# Patient Record
Sex: Female | Born: 1960 | Race: White | Hispanic: No | Smoking: Former smoker
Health system: Southern US, Community
[De-identification: ages and names within clinical notes are randomized; demographics above are authoritative.]

## PROBLEM LIST (undated history)

## (undated) DIAGNOSIS — Z86718 Personal history of other venous thrombosis and embolism: Secondary | ICD-10-CM

## (undated) DIAGNOSIS — M7500 Adhesive capsulitis of unspecified shoulder: Secondary | ICD-10-CM

## (undated) DIAGNOSIS — N809 Endometriosis, unspecified: Secondary | ICD-10-CM

## (undated) DIAGNOSIS — J45990 Exercise induced bronchospasm: Secondary | ICD-10-CM

## (undated) DIAGNOSIS — Z9289 Personal history of other medical treatment: Secondary | ICD-10-CM

## (undated) DIAGNOSIS — Z87828 Personal history of other (healed) physical injury and trauma: Secondary | ICD-10-CM

## (undated) DIAGNOSIS — Z87898 Personal history of other specified conditions: Secondary | ICD-10-CM

## (undated) HISTORY — PX: TUBAL LIGATION: SHX77

## (undated) HISTORY — DX: Endometriosis, unspecified: N80.9

## (undated) HISTORY — DX: Exercise induced bronchospasm: J45.990

## (undated) HISTORY — DX: Personal history of other specified conditions: Z87.898

## (undated) HISTORY — DX: Personal history of other medical treatment: Z92.89

## (undated) HISTORY — DX: Adhesive capsulitis of unspecified shoulder: M75.00

## (undated) HISTORY — PX: OTHER SURGICAL HISTORY: SHX169

## (undated) HISTORY — DX: Personal history of other (healed) physical injury and trauma: Z87.828

## (undated) HISTORY — DX: Personal history of other venous thrombosis and embolism: Z86.718

---
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
## 1997-12-09 ENCOUNTER — Inpatient Hospital Stay (HOSPITAL_COMMUNITY): Admission: AD | Admit: 1997-12-09 | Discharge: 1997-12-12 | Payer: Self-pay | Admitting: Obstetrics and Gynecology

## 1999-01-26 ENCOUNTER — Other Ambulatory Visit: Admission: RE | Admit: 1999-01-26 | Discharge: 1999-01-26 | Payer: Self-pay | Admitting: Obstetrics and Gynecology

## 2000-03-02 ENCOUNTER — Other Ambulatory Visit: Admission: RE | Admit: 2000-03-02 | Discharge: 2000-03-02 | Payer: Self-pay | Admitting: Obstetrics and Gynecology

## 2001-06-07 ENCOUNTER — Other Ambulatory Visit: Admission: RE | Admit: 2001-06-07 | Discharge: 2001-06-07 | Payer: Self-pay | Admitting: Obstetrics and Gynecology

## 2002-07-04 ENCOUNTER — Other Ambulatory Visit: Admission: RE | Admit: 2002-07-04 | Discharge: 2002-07-04 | Payer: Self-pay | Admitting: Obstetrics and Gynecology

## 2002-09-04 DIAGNOSIS — Z86718 Personal history of other venous thrombosis and embolism: Secondary | ICD-10-CM

## 2002-09-04 HISTORY — DX: Personal history of other venous thrombosis and embolism: Z86.718

## 2003-01-09 ENCOUNTER — Ambulatory Visit (HOSPITAL_COMMUNITY): Admission: RE | Admit: 2003-01-09 | Discharge: 2003-01-09 | Payer: Self-pay | Admitting: Obstetrics and Gynecology

## 2003-01-20 ENCOUNTER — Emergency Department (HOSPITAL_COMMUNITY): Admission: EM | Admit: 2003-01-20 | Discharge: 2003-01-21 | Payer: Self-pay | Admitting: Emergency Medicine

## 2003-01-21 ENCOUNTER — Ambulatory Visit (HOSPITAL_COMMUNITY): Admission: RE | Admit: 2003-01-21 | Discharge: 2003-01-21 | Payer: Self-pay | Admitting: Family Medicine

## 2003-02-02 ENCOUNTER — Encounter: Payer: Self-pay | Admitting: Hematology & Oncology

## 2003-02-02 ENCOUNTER — Ambulatory Visit (HOSPITAL_COMMUNITY): Admission: RE | Admit: 2003-02-02 | Discharge: 2003-02-02 | Payer: Self-pay | Admitting: Hematology & Oncology

## 2003-02-13 ENCOUNTER — Encounter: Payer: Self-pay | Admitting: Family Medicine

## 2003-02-13 ENCOUNTER — Ambulatory Visit (HOSPITAL_COMMUNITY): Admission: RE | Admit: 2003-02-13 | Discharge: 2003-02-13 | Payer: Self-pay | Admitting: *Deleted

## 2003-05-01 ENCOUNTER — Encounter: Payer: Self-pay | Admitting: Family Medicine

## 2003-05-01 ENCOUNTER — Ambulatory Visit (HOSPITAL_COMMUNITY): Admission: RE | Admit: 2003-05-01 | Discharge: 2003-05-01 | Payer: Self-pay | Admitting: Family Medicine

## 2003-07-10 ENCOUNTER — Other Ambulatory Visit: Admission: RE | Admit: 2003-07-10 | Discharge: 2003-07-10 | Payer: Self-pay | Admitting: Obstetrics and Gynecology

## 2003-08-13 ENCOUNTER — Ambulatory Visit (HOSPITAL_COMMUNITY): Admission: RE | Admit: 2003-08-13 | Discharge: 2003-08-13 | Payer: Self-pay | Admitting: Family Medicine

## 2005-10-20 ENCOUNTER — Ambulatory Visit (HOSPITAL_COMMUNITY): Admission: RE | Admit: 2005-10-20 | Discharge: 2005-10-20 | Payer: Self-pay | Admitting: Obstetrics and Gynecology

## 2006-09-04 HISTORY — PX: ROTATOR CUFF REPAIR: SHX139

## 2006-09-11 ENCOUNTER — Ambulatory Visit (HOSPITAL_COMMUNITY): Admission: RE | Admit: 2006-09-11 | Discharge: 2006-09-11 | Payer: Self-pay | Admitting: Family Medicine

## 2006-10-11 ENCOUNTER — Ambulatory Visit (HOSPITAL_BASED_OUTPATIENT_CLINIC_OR_DEPARTMENT_OTHER): Admission: RE | Admit: 2006-10-11 | Discharge: 2006-10-11 | Payer: Self-pay | Admitting: Orthopedic Surgery

## 2006-10-24 ENCOUNTER — Ambulatory Visit (HOSPITAL_COMMUNITY): Admission: RE | Admit: 2006-10-24 | Discharge: 2006-10-24 | Payer: Self-pay | Admitting: Obstetrics and Gynecology

## 2006-10-29 ENCOUNTER — Encounter: Admission: RE | Admit: 2006-10-29 | Discharge: 2007-01-27 | Payer: Self-pay | Admitting: Orthopedic Surgery

## 2006-11-27 ENCOUNTER — Encounter: Admission: RE | Admit: 2006-11-27 | Discharge: 2006-11-27 | Payer: Self-pay | Admitting: Obstetrics and Gynecology

## 2007-01-28 ENCOUNTER — Encounter: Admission: RE | Admit: 2007-01-28 | Discharge: 2007-02-01 | Payer: Self-pay | Admitting: Orthopedic Surgery

## 2007-12-02 ENCOUNTER — Ambulatory Visit (HOSPITAL_COMMUNITY): Admission: RE | Admit: 2007-12-02 | Discharge: 2007-12-02 | Payer: Self-pay | Admitting: Family Medicine

## 2007-12-02 ENCOUNTER — Ambulatory Visit: Payer: Self-pay | Admitting: *Deleted

## 2007-12-02 ENCOUNTER — Encounter (INDEPENDENT_AMBULATORY_CARE_PROVIDER_SITE_OTHER): Payer: Self-pay | Admitting: Family Medicine

## 2007-12-11 ENCOUNTER — Encounter (INDEPENDENT_AMBULATORY_CARE_PROVIDER_SITE_OTHER): Payer: Self-pay | Admitting: Family Medicine

## 2007-12-11 ENCOUNTER — Ambulatory Visit: Admission: RE | Admit: 2007-12-11 | Discharge: 2007-12-11 | Payer: Self-pay | Admitting: Family Medicine

## 2008-01-03 ENCOUNTER — Ambulatory Visit: Payer: Self-pay | Admitting: Vascular Surgery

## 2008-03-10 ENCOUNTER — Ambulatory Visit (HOSPITAL_COMMUNITY): Admission: RE | Admit: 2008-03-10 | Discharge: 2008-03-10 | Payer: Self-pay | Admitting: Family Medicine

## 2008-03-13 ENCOUNTER — Other Ambulatory Visit: Admission: RE | Admit: 2008-03-13 | Discharge: 2008-03-13 | Payer: Self-pay | Admitting: Family Medicine

## 2009-09-06 ENCOUNTER — Ambulatory Visit (HOSPITAL_COMMUNITY): Admission: RE | Admit: 2009-09-06 | Discharge: 2009-09-06 | Payer: Self-pay | Admitting: Family Medicine

## 2009-11-12 ENCOUNTER — Ambulatory Visit (HOSPITAL_COMMUNITY): Admission: RE | Admit: 2009-11-12 | Discharge: 2009-11-12 | Payer: Self-pay | Admitting: Family Medicine

## 2009-12-03 ENCOUNTER — Other Ambulatory Visit: Admission: RE | Admit: 2009-12-03 | Discharge: 2009-12-03 | Payer: Self-pay | Admitting: Family Medicine

## 2009-12-03 LAB — HM PAP SMEAR: HM Pap smear: NEGATIVE

## 2010-05-03 ENCOUNTER — Ambulatory Visit: Payer: Self-pay | Admitting: Gynecology

## 2010-05-03 ENCOUNTER — Ambulatory Visit (HOSPITAL_COMMUNITY)
Admission: RE | Admit: 2010-05-03 | Discharge: 2010-05-03 | Payer: Self-pay | Source: Home / Self Care | Admitting: Family Medicine

## 2010-05-03 ENCOUNTER — Inpatient Hospital Stay (HOSPITAL_COMMUNITY): Admission: AD | Admit: 2010-05-03 | Discharge: 2010-05-04 | Payer: Self-pay | Admitting: Obstetrics & Gynecology

## 2010-09-25 ENCOUNTER — Encounter: Payer: Self-pay | Admitting: Family Medicine

## 2010-10-17 ENCOUNTER — Other Ambulatory Visit (HOSPITAL_COMMUNITY): Payer: Self-pay | Admitting: Family Medicine

## 2010-10-17 DIAGNOSIS — R911 Solitary pulmonary nodule: Secondary | ICD-10-CM

## 2010-10-21 ENCOUNTER — Ambulatory Visit (HOSPITAL_COMMUNITY)
Admission: RE | Admit: 2010-10-21 | Discharge: 2010-10-21 | Disposition: A | Discharge status: Home / Self Care | Payer: 59 | Source: Ambulatory Visit | Attending: Family Medicine | Admitting: Family Medicine

## 2010-10-21 DIAGNOSIS — Z09 Encounter for follow-up examination after completed treatment for conditions other than malignant neoplasm: Secondary | ICD-10-CM | POA: Insufficient documentation

## 2010-10-21 DIAGNOSIS — R911 Solitary pulmonary nodule: Secondary | ICD-10-CM

## 2010-10-21 DIAGNOSIS — J984 Other disorders of lung: Secondary | ICD-10-CM | POA: Insufficient documentation

## 2010-10-21 MED ORDER — IOHEXOL 300 MG/ML  SOLN
80.0000 mL | Freq: Once | INTRAMUSCULAR | Status: AC | PRN
  Administered 2010-10-21: 80 mL via INTRAVENOUS

## 2010-11-18 LAB — URINALYSIS, ROUTINE W REFLEX MICROSCOPIC
Ketones, ur: NEGATIVE mg/dL
Protein, ur: NEGATIVE mg/dL
Specific Gravity, Urine: <1.005 — ABNORMAL LOW (ref 1.005–1.030)
Urobilinogen, UA: 0.2 mg/dL (ref 0.0–1.0)

## 2010-11-18 LAB — CBC
HCT: 36.7 % (ref 36.0–46.0)
Hemoglobin: 12.4 g/dL (ref 12.0–15.0)
RBC: 3.69 MIL/uL — ABNORMAL LOW (ref 3.87–5.11)
RDW: 12.3 % (ref 11.5–15.5)
WBC: 10.7 10*3/uL — ABNORMAL HIGH (ref 4.0–10.5)

## 2010-11-18 LAB — POCT PREGNANCY, URINE: Preg Test, Ur: NEGATIVE

## 2010-11-18 LAB — DIFFERENTIAL
Eosinophils Absolute: 0.1 10*3/uL (ref 0.0–0.7)
Eosinophils Relative: 1 % (ref 0–5)
Lymphs Abs: 2.5 10*3/uL (ref 0.7–4.0)
Neutro Abs: 7.5 10*3/uL (ref 1.7–7.7)
Neutrophils Relative %: 70 % (ref 43–77)

## 2010-11-18 LAB — GC/CHLAMYDIA PROBE AMP, GENITAL: Chlamydia, DNA Probe: NEGATIVE

## 2010-11-18 LAB — URINE MICROSCOPIC-ADD ON

## 2010-11-18 LAB — WET PREP, GENITAL
Trich, Wet Prep: NONE SEEN
Yeast Wet Prep HPF POC: NONE SEEN

## 2010-12-20 ENCOUNTER — Other Ambulatory Visit (HOSPITAL_COMMUNITY): Payer: Self-pay | Admitting: Family Medicine

## 2010-12-20 DIAGNOSIS — Z1231 Encounter for screening mammogram for malignant neoplasm of breast: Secondary | ICD-10-CM

## 2010-12-23 ENCOUNTER — Ambulatory Visit (HOSPITAL_COMMUNITY)
Admission: RE | Admit: 2010-12-23 | Discharge: 2010-12-23 | Disposition: A | Discharge status: Home / Self Care | Payer: 59 | Source: Ambulatory Visit | Attending: Family Medicine | Admitting: Family Medicine

## 2010-12-23 DIAGNOSIS — Z1231 Encounter for screening mammogram for malignant neoplasm of breast: Secondary | ICD-10-CM | POA: Insufficient documentation

## 2011-01-11 ENCOUNTER — Inpatient Hospital Stay (INDEPENDENT_AMBULATORY_CARE_PROVIDER_SITE_OTHER)
Admission: RE | Admit: 2011-01-11 | Discharge: 2011-01-11 | Disposition: A | Discharge status: Home / Self Care | Payer: 59 | Source: Ambulatory Visit | Attending: Family Medicine | Admitting: Family Medicine

## 2011-01-11 DIAGNOSIS — L255 Unspecified contact dermatitis due to plants, except food: Secondary | ICD-10-CM

## 2011-01-17 NOTE — Consult Note (Signed)
NEW PATIENT CONSULTATION   Audrey [name], Audrey [name]  DOB:  October 06, 1960                                       01/03/2008  [id number]#:[id number]   The patient presents today for evaluation of right leg discomfort and  evaluation of prior history of deep venous thrombosis.  The patient is a  healthy, active 50 year old white female who had a any deep venous  thrombosis in the right popliteal area 5 years ago.  She reports that,  off and on since that time, especially around the time of her menses,  she has noted some discomfort in the right proximal medial calf.  Over  the past few months, this has been more progressive and she has  undergone 2 negative duplex exams to show any recurrent deep venous  thrombosis.  There was some thickening but I do not have a report  regarding if she has valvular incompetence documented.  She reports that  she does wear knee-high compression garments when she has a flare-up.  She reports that these are relatively easy to place at the beginning of  the day, but become more restrictive throughout the day on the right  versus the left with minimal swelling on the left.  She is seen today  for further evaluation regarding an aching discomfort in her leg.  She  reports that she was on Lovenox and Coumadin at the time of her deep  venous thrombosis.  No etiology of this was ascertained.   PAST MEDICAL HISTORY:  Is negative for any other thrombotic events.  She  is status post of 4 C-sections, but does not have any other major  medical difficulties.   SOCIAL HISTORY:  Is negative for smoking.   PHYSICAL EXAM:  Well-developed, well-nourished white female appearing  stated age of 11.  She has 2+ dorsalis pedis pulses bilaterally.  She  does have a scattered telangiectasia on both legs in the calves and  thighs.  I have imaged her right popliteal vein with a handheld duplex  and this does show patency of her vein.  There is some thickening in the  popliteal vein.  Her saphenous vein is without incompetence.   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  She is having discomfort  with the swelling and this goes along with her menses.  I explained that  this would not preclude her to any new increased problem and that it  will probability be a slowly progressive issue over time.  I explained  the importance of compliance with wearing her graduated compression  garments on a daily basis.  I explained that this should help reduce her  risk for progressive changes of venous hypertension over time.  I did  explain the incidence of valvular incompetence following her right leg  deep venous thrombosis.  She was reassured with these discussion and  will see [place] again on an as-needed basis.   Larina Earthly, M.D.  Electronically Signed   TFE/MEDQ  D:  01/03/2008  T:  01/06/2008  Job:  [id number]   cc:   [facility] - Lajoyce Corners, M.D.

## 2011-01-20 NOTE — Op Note (Signed)
Audrey [name], Audrey [name]               ACCOUNT NO.:  [id number]   MEDICAL RECORD NO.:  [id number]          PATIENT TYPE:  AMB   LOCATION:  [facility]                          FACILITY:  [facility]   PHYSICIAN:  Nadara Mustard, MD     DATE OF BIRTH:  01-22-1961   DATE OF PROCEDURE:  10/11/2006  DATE OF DISCHARGE:                               OPERATIVE REPORT   PREOPERATIVE DIAGNOSIS:  Frozen left shoulder with impingement syndrome,  rotator cuff tear, and SLAP lesion.   POSTOPERATIVE DIAGNOSIS:  Frozen left shoulder with impingement  syndrome, rotator cuff tear, and SLAP lesion.   PROCEDURE:  1. Manipulation under anesthesia.  2. Debridement rotator cuff tear, debridement SLAP lesion.  3. Subacromial decompression.   SURGEON:  Nadara Mustard, M.D.   ASSISTANT:  None.   ANESTHESIA:  General plus interscalene block.   ESTIMATED BLOOD LOSS:  Minimal.   DRAINS:  None.   COMPLICATIONS:  None.   DISPOSITION:  To the PACU in stable condition.   INDICATIONS FOR PROCEDURE:  The patient is a 50 year old woman with a  work related injury to her left shoulder.  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  The risks and benefits were  discussed including infection, neurovascular injury, persistent pain,  and need for additional surgery.  The patient states she understands and  wished proceed at this time.   DESCRIPTION OF PROCEDURE:  The patient was brought to OR room 1 after  undergoing an interscalene block.  The patient underwent general  anesthetic.  After an adequate level of anesthesia was obtained, the  patient was placed in the beach chair position and the left upper  extremity was prepped using DuraPrep and draped into a sterile field.  The scope was inserted through the posterior portal, an anterior portal  was  established with outside in technique with an 18 gauge spinal.  Visualization after manipulation under anesthesia showed release of the  adhesions to the subscapularis. She had inflammation and synovitis of  the entire inferior surface of the rotator cuff. She has a grade 1 SLAP  lesion and partial tearing of the rotator cuff.  The biceps tendon was  intact.  The patient underwent debridement of the subscapularis  adhesions.  She underwent debridement of the SLAP lesion and debridement  of the partial tearing of the rotator cuff.  She had good attachment of  the rotator cuff on the humeral head.  After debridement, the  instruments removed.  The scope was then inserted from the posterior  portal in the subacromial space and a lateral portal was established.  Visualization showed a very tight subacromial space with a hook type 3  acromion.  The patient underwent subacromial decompression with the  acromionizer. She underwent debridement of the bursal tissue and of the  partial tearing of the rotator cuff.  The vapor was also used for  hemostasis.  The instruments were removed.  The portals  were closed  using 4-0 nylon.  The wounds were covered Adaptic orthopedic sponges,  ABD dressing, and paper tape.  The patient was extubated, placed in a  sling, and taken to the PACU in stable condition.  She had PAS hose in  place during surgery due to her history of previous site calf DVT. She  will take one aspirin 325 mg p.o. daily for a month postoperatively.  Plan to follow-up in the office in 1-2 weeks.      Nadara Mustard, MD  Electronically Signed     MVD/MEDQ  D:  10/11/2006  T:  10/11/2006  Job:  [id number]

## 2011-04-20 ENCOUNTER — Emergency Department (HOSPITAL_COMMUNITY)
Admission: EM | Admit: 2011-04-20 | Discharge: 2011-04-20 | Disposition: A | Discharge status: Home / Self Care | Payer: PRIVATE HEALTH INSURANCE | Attending: Emergency Medicine | Admitting: Emergency Medicine

## 2011-04-20 ENCOUNTER — Emergency Department (HOSPITAL_COMMUNITY): Payer: PRIVATE HEALTH INSURANCE

## 2011-04-20 DIAGNOSIS — W268XXA Contact with other sharp object(s), not elsewhere classified, initial encounter: Secondary | ICD-10-CM | POA: Insufficient documentation

## 2011-04-20 DIAGNOSIS — Y990 Civilian activity done for income or pay: Secondary | ICD-10-CM | POA: Insufficient documentation

## 2011-04-20 DIAGNOSIS — S61209A Unspecified open wound of unspecified finger without damage to nail, initial encounter: Secondary | ICD-10-CM | POA: Insufficient documentation

## 2011-04-20 DIAGNOSIS — Z7982 Long term (current) use of aspirin: Secondary | ICD-10-CM | POA: Insufficient documentation

## 2011-04-20 DIAGNOSIS — Z09 Encounter for follow-up examination after completed treatment for conditions other than malignant neoplasm: Secondary | ICD-10-CM | POA: Insufficient documentation

## 2011-04-20 DIAGNOSIS — Y9210 Unspecified residential institution as the place of occurrence of the external cause: Secondary | ICD-10-CM | POA: Insufficient documentation

## 2011-04-20 DIAGNOSIS — Z86718 Personal history of other venous thrombosis and embolism: Secondary | ICD-10-CM | POA: Insufficient documentation

## 2011-04-20 DIAGNOSIS — Z79899 Other long term (current) drug therapy: Secondary | ICD-10-CM | POA: Insufficient documentation

## 2011-06-14 NOTE — Consult Note (Signed)
NAMELULU, HIRSCHMANN NO.:  [id number]  MEDICAL RECORD NO.:  [id number]  LOCATION:  [facility]                         FACILITY:  [facility]  PHYSICIAN:  Betha Loa, MD        DATE OF BIRTH:  [birth year]  DATE OF CONSULTATION:  04/20/2011 DATE OF DISCHARGE:  04/20/2011                                CONSULTATION   REFERRED BY:  Glynn Octave, MD.  REASON FOR CONSULTATION:  Right thumb laceration.  HISTORY:  Ms. 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  She states that while at work she tried to use a knife to adjust a walker, the knife slammed close and lacerating the tip of the thumb.  She was seen in Occupational Health and in the emergency department.  At which time, the wound was irrigated and closed.  She was seen by Dr. Manus Gunning who felt that the injury warranted been seen by a hand surgeon.  She was returned to the emergency department for evaluation.  She reports no previous injuries to the thumb and no other injuries at this time.  ALLERGIES:  CODEINE.  PAST MEDICAL HISTORY:  Blood clots.  PAST SURGICAL HISTORY:  C-section, tonsillectomy, appendectomy.  MEDICATIONS:  Aspirin 81 mg daily, vitamin D, fish oil, calcium, multivitamin, triamterene/hydrochlorothiazide 37.5/25 p.r.n.  FAMILY HISTORY:  Positive for heart disease, hypertension, arthritis.  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  She is not smoke. Drinks alcohol only occasionally.  REVIEW OF SYSTEMS:  A 13-point review of systems is negative with exception of eyeglasses, and blood clots.  PHYSICAL EXAMINATION:  GENERAL:  Alert and oriented x3, well-developed, well-nourished, resting comfortably in the hospital stretcher. EXTREMITIES:  Bilateral upper extremity have intact sensation and capillary refill in all fingertips.  She can flex and extend the IP joints of her thumbs across her fingers.  Left upper extremity is without wounds, without  tenderness to palpation.  Right upper extremity, with the exception of the thumb, has no wounds or tenderness to palpation.  In the right thumb, there is a longitudinal laceration going to the nail.  This has been treated with Dermabond and sutures.  RADIOGRAPHY:  Radiographs reveal a tuft fracture with minimal displacement.  ASSESSMENT/PLAN:  Right thumb tip laceration.  Discussed with Ms. Foxworthy the nature of this injury.  I recommended irrigation and debridement of the wound and repair of the nail-bed laceration.  Risks, benefits, and alternatives of procedure were discussed including the risks of blood loss; infection; damage to nerves, vessels, tendons, ligaments, and bone; failure of procedure; the need for additional procedures; complications with wound healing; continued pain.  She voiced understanding of these risks and elected to proceed.  PROCEDURE:  In the emergency department, digital anesthesia of the thumb was obtained.  The thumb was irrigated with 1000 mL of sterile saline and Betadine solution after the sutures have been removed.  The thumb was prepped with Betadine solution.  There was no gross contamination. The skin was repaired with 5-0 Monocryl suture.  The nail was removed with a freer elevator.  The nail bed laceration was repaired with a 6-0 chromic gut suture  in an interrupted fashion.  A piece of Xeroform was placed in the nail fold.  The wounds were dressed with sterile Xeroform, 2 x 2, wrapped with a Kling and Coban dressing lightly.  The tuft fracture had been reduced.     Betha Loa, MD     KK/MEDQ  D:  05/24/2011  T:  05/25/2011  Job:  [id number]  Electronically Signed by Betha Loa  on 06/14/2011 09:59:57 AM

## 2011-12-29 ENCOUNTER — Other Ambulatory Visit (HOSPITAL_COMMUNITY): Payer: Self-pay | Admitting: Family Medicine

## 2011-12-29 DIAGNOSIS — Z1231 Encounter for screening mammogram for malignant neoplasm of breast: Secondary | ICD-10-CM

## 2012-02-02 ENCOUNTER — Ambulatory Visit (HOSPITAL_COMMUNITY)
Admission: RE | Admit: 2012-02-02 | Discharge: 2012-02-02 | Disposition: A | Discharge status: Home / Self Care | Payer: 59 | Source: Ambulatory Visit | Attending: Family Medicine | Admitting: Family Medicine

## 2012-02-02 DIAGNOSIS — Z1231 Encounter for screening mammogram for malignant neoplasm of breast: Secondary | ICD-10-CM | POA: Insufficient documentation

## 2012-07-05 DIAGNOSIS — N8003 Adenomyosis of the uterus: Secondary | ICD-10-CM

## 2012-07-05 DIAGNOSIS — N800 Endometriosis of uterus: Secondary | ICD-10-CM

## 2012-07-05 HISTORY — DX: Adenomyosis of the uterus: N80.03

## 2012-07-05 HISTORY — DX: Endometriosis of uterus: N80.0

## 2012-07-08 ENCOUNTER — Telehealth: Payer: Self-pay | Admitting: Hematology & Oncology

## 2012-07-08 NOTE — Telephone Encounter (Signed)
Received referral from Dr Tresa Res for hematology with coagulopathy workup but there are no labs with referral. I called pt's PCP Dr. Shaune Pollack and they will fax me what they have.

## 2012-07-31 ENCOUNTER — Telehealth: Payer: Self-pay | Admitting: Hematology & Oncology

## 2012-07-31 ENCOUNTER — Other Ambulatory Visit: Payer: Self-pay

## 2012-07-31 NOTE — Telephone Encounter (Signed)
PT AWARE OF 08-13-12 APPOINTMENT

## 2012-07-31 NOTE — Telephone Encounter (Signed)
Pt aware of 12-10 appointment °

## 2012-08-13 ENCOUNTER — Other Ambulatory Visit (HOSPITAL_BASED_OUTPATIENT_CLINIC_OR_DEPARTMENT_OTHER): Payer: 59 | Admitting: Lab

## 2012-08-13 ENCOUNTER — Other Ambulatory Visit: Payer: Self-pay | Admitting: Medical

## 2012-08-13 ENCOUNTER — Ambulatory Visit: Payer: Self-pay | Admitting: Hematology & Oncology

## 2012-08-13 ENCOUNTER — Ambulatory Visit: Payer: 59

## 2012-08-13 DIAGNOSIS — D689 Coagulation defect, unspecified: Secondary | ICD-10-CM

## 2012-08-15 LAB — HYPERCOAGULABLE PANEL, COMPREHENSIVE
AntiThromb III Func: 111 % (ref 76–126)
Anticardiolipin IgA: 4 APL U/mL (ref <22)
Beta-2 Glyco I IgG: 2 G Units (ref <20)
Beta-2-Glycoprotein I IgA: 8 A Units (ref <20)
Beta-2-Glycoprotein I IgM: 30 M Units — ABNORMAL HIGH (ref <20)
PTT Lupus Anticoagulant: 33.6 secs (ref 28.0–43.0)
Protein C, Total: 94 % (ref 72–160)

## 2012-08-16 ENCOUNTER — Telehealth: Payer: Self-pay | Admitting: Hematology & Oncology

## 2012-08-16 NOTE — Telephone Encounter (Signed)
Spoke with Arna Medici at Dr. Gretta Cool office they will fax me note and referral

## 2012-08-23 ENCOUNTER — Ambulatory Visit (HOSPITAL_BASED_OUTPATIENT_CLINIC_OR_DEPARTMENT_OTHER): Payer: 59 | Admitting: Hematology & Oncology

## 2012-08-23 VITALS — BP 109/54 | HR 65 | Temp 98.0°F | Resp 16 | Ht 64.0 in | Wt 161.0 lb

## 2012-08-23 DIAGNOSIS — I82409 Acute embolism and thrombosis of unspecified deep veins of unspecified lower extremity: Secondary | ICD-10-CM

## 2012-08-23 DIAGNOSIS — Z130 Encounter for screening for diseases of the blood and blood-forming organs and certain disorders involving the immune mechanism: Secondary | ICD-10-CM

## 2012-08-23 DIAGNOSIS — Z86718 Personal history of other venous thrombosis and embolism: Secondary | ICD-10-CM

## 2012-08-23 NOTE — Progress Notes (Signed)
This office note has been dictated.

## 2012-08-24 NOTE — Progress Notes (Signed)
CC:   Cynthia P. Romine, M.D. Duncan Dull, M.D.  DIAGNOSIS:  Past history of deep venous thrombosis of the right leg.  HISTORY OF PRESENT ILLNESS:  [name] is a very nice 51 year old white female.  I actually saw her about 8 years ago.  At that point in time, she was referred for anticoagulation recommendations.  We recommended 1 year of anticoagulation with Coumadin.  She did this without any problems.  She has been on aspirin.  She has been having some issues with uterine bleeding.  She has seen Dr. Meredeth Ide.  Dr. Tresa Res felt that she had adenomyosis.  In order to treat this, she had a Mirena IUD placed.  Dr. Tresa Res, however, wanted to make sure that nothing else was going on with respect to her hypercoagulability.  As such, [name] was referred back to the [facility] for an evaluation.  She, again, has had no problems clotting since we saw her 8 years ago. She does wear an occasional compression stocking for the right leg.  She does have some occasional leg pain.  She may have some postphlebitic issues.  She is working.  She has had no cough or shortness of breath.  She has had no change in bowel or bladder habits.  She gets routine mammograms yearly.  She has not had a colonoscopy as of yet.  There has been no headache.  She has had no double vision or blurred vision.  She states occasional swelling in the right lower leg when she is up a lot.  Again, we were asked to re-evaluate her.  PAST MEDICAL HISTORY: 1. Remarkable for the adenomyosis. 2. DVT of the right leg. 3. Hypertension.  ALLERGIES:  Codeine.  MEDICATIONS: 1. Aspirin 81 mg p.o. daily. 2. Vitamin D 1000 units p.o. daily. 3. Hydrochlorothiazide 12.5 mg p.o. as needed.  SOCIAL HISTORY:  Negative for tobacco use.  There is no alcohol use. There is no obvious occupational exposure.  FAMILY HISTORY:  Remarkable for multiple members with cancer.  There is no history  of blood clots in the family.  REVIEW OF SYSTEMS:  As stated in history of present illness.  No additional findings are noted on a 12 system review.  PHYSICAL EXAMINATION:  General:  This is a well-developed, well- nourished white female in no obvious distress.  Vital signs:  Show temperature of 98, pulse 65, respiratory rate 18, blood pressure 109/54. Weight is 161.  Head and neck:  Shows a normocephalic, atraumatic skull. There are no ocular or oral lesions.  There are no palpable cervical or supraclavicular lymph nodes.  Lungs:  Clear bilaterally.  Cardiac: Regular rate and rhythm with a normal S1, S2.  There are no murmurs, rubs or bruits.  Abdomen:  Soft with good bowel sounds.  There is no palpable abdominal mass.  There is no palpable hepatosplenomegaly. Back:  Shows no tenderness over the spine, ribs or hips.  Extremities: Show minimal nonpitting edema of the right lower leg.  No palpable venous cord is noted in the right leg.  She has a negative Homan's sign. She has good pulses in her distal extremities bilaterally. Neurological:  Shows no focal neurological deficits.  Skin:  Shows no rashes, ecchymoses or petechiae.  LABORATORY STUDIES:  Show a hypercoagulable panel with a minimally elevated IgM anticardiolipin antibody and beta 2 glycoprotein.  IMPRESSION:  [name] is a very charming 51 year old white female.  I have not seen her for 8 years.  I really do not see any obvious hypercoagulable issues.  Again, her anticardiolipin and beta 2 glycoprotein IgM antibodies were minimally elevated.  I would be surprised if these were a factor with clotting.  She has not had a deep vein thrombosis now for 8 years.  Of note, she has had 4 kids.  She had no problems with her pregnancies, although she did require C-sections because of lack of contractions during labor.  I do not see any problems with Dr. Tresa Res treating the adenomyosis.  She just cannot be on oral  contraceptives that contain estrogen.  Dr. Tresa Res is fully aware of this and has not used this because of her past clotting history.  I do not see any additional studies that we need to run on Ms. Cain Saupe.  It was nice to see her again.  It sure was fun talking to her.  We do not need to get her back to the office unless there is a problem. I spent about an hour with her today.    ______________________________ Josph Macho, M.D. PRE/MEDQ  D:  08/23/2012  T:  08/24/2012  Job:  [id number]

## 2012-10-07 ENCOUNTER — Other Ambulatory Visit (HOSPITAL_COMMUNITY): Payer: Self-pay | Admitting: Family Medicine

## 2012-10-07 DIAGNOSIS — R911 Solitary pulmonary nodule: Secondary | ICD-10-CM

## 2012-10-25 ENCOUNTER — Ambulatory Visit (HOSPITAL_COMMUNITY)
Admission: RE | Admit: 2012-10-25 | Discharge: 2012-10-25 | Disposition: A | Discharge status: Home / Self Care | Payer: 59 | Source: Ambulatory Visit | Attending: Family Medicine | Admitting: Family Medicine

## 2012-10-25 DIAGNOSIS — R911 Solitary pulmonary nodule: Secondary | ICD-10-CM

## 2012-10-25 DIAGNOSIS — R918 Other nonspecific abnormal finding of lung field: Secondary | ICD-10-CM | POA: Insufficient documentation

## 2012-11-08 ENCOUNTER — Other Ambulatory Visit: Payer: Self-pay | Admitting: Gastroenterology

## 2013-05-14 ENCOUNTER — Other Ambulatory Visit (HOSPITAL_COMMUNITY): Payer: Self-pay | Admitting: Family Medicine

## 2013-05-14 DIAGNOSIS — Z1231 Encounter for screening mammogram for malignant neoplasm of breast: Secondary | ICD-10-CM

## 2013-05-16 ENCOUNTER — Ambulatory Visit (HOSPITAL_COMMUNITY)
Admission: RE | Admit: 2013-05-16 | Discharge: 2013-05-16 | Disposition: A | Discharge status: Home / Self Care | Payer: 59 | Source: Ambulatory Visit | Attending: Family Medicine | Admitting: Family Medicine

## 2013-05-16 DIAGNOSIS — Z1231 Encounter for screening mammogram for malignant neoplasm of breast: Secondary | ICD-10-CM | POA: Insufficient documentation

## 2013-05-19 LAB — HM MAMMOGRAPHY

## 2013-05-20 ENCOUNTER — Other Ambulatory Visit: Payer: Self-pay | Admitting: Family Medicine

## 2013-05-20 DIAGNOSIS — R928 Other abnormal and inconclusive findings on diagnostic imaging of breast: Secondary | ICD-10-CM

## 2013-05-21 ENCOUNTER — Telehealth: Payer: Self-pay | Admitting: Obstetrics and Gynecology

## 2013-05-21 NOTE — Telephone Encounter (Signed)
Mammogram was ordered by pcp. Can send results to practice and patient can choose who she would like to see when scheduling appointment.

## 2013-05-21 NOTE — Telephone Encounter (Signed)
Spoke with patient. She would like to see Dr. Edward Jolly, states she will have diagnostic mammogram results to Dr. Edward Jolly and AEX appointment booked.

## 2013-05-21 NOTE — Telephone Encounter (Signed)
Patient needs to know who they need to send her mammogram results to ? She was a patient of Dr. Tresa Res but, has not been placed with another dr. Please advise. She is also having a diagnostic Ultra sound on the 30 th.

## 2013-06-03 ENCOUNTER — Ambulatory Visit
Admission: RE | Admit: 2013-06-03 | Discharge: 2013-06-03 | Disposition: A | Discharge status: Home / Self Care | Payer: 59 | Source: Ambulatory Visit | Attending: Family Medicine | Admitting: Family Medicine

## 2013-06-03 DIAGNOSIS — R928 Other abnormal and inconclusive findings on diagnostic imaging of breast: Secondary | ICD-10-CM

## 2013-08-13 ENCOUNTER — Ambulatory Visit (INDEPENDENT_AMBULATORY_CARE_PROVIDER_SITE_OTHER): Payer: 59 | Admitting: Obstetrics and Gynecology

## 2013-08-13 ENCOUNTER — Encounter: Payer: Self-pay | Admitting: Obstetrics and Gynecology

## 2013-08-13 VITALS — BP 110/66 | HR 64 | Resp 16 | Ht 63.5 in | Wt 185.0 lb

## 2013-08-13 DIAGNOSIS — Z0000 Encounter for general adult medical examination without abnormal findings: Secondary | ICD-10-CM

## 2013-08-13 DIAGNOSIS — Z01419 Encounter for gynecological examination (general) (routine) without abnormal findings: Secondary | ICD-10-CM

## 2013-08-13 LAB — POCT URINALYSIS DIPSTICK
Bilirubin, UA: NEGATIVE
Blood, UA: NEGATIVE
Nitrite, UA: NEGATIVE
pH, UA: 5
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
## 2013-08-13 NOTE — Patient Instructions (Signed)

## 2014-02-09 ENCOUNTER — Ambulatory Visit (INDEPENDENT_AMBULATORY_CARE_PROVIDER_SITE_OTHER): Payer: 59 | Admitting: Physician Assistant

## 2014-02-09 VITALS — BP 102/66 | HR 56 | Temp 97.7°F | Resp 16 | Ht 63.5 in | Wt 172.0 lb

## 2014-02-09 DIAGNOSIS — T63441A Toxic effect of venom of bees, accidental (unintentional), initial encounter: Secondary | ICD-10-CM

## 2014-02-09 DIAGNOSIS — T63461A Toxic effect of venom of wasps, accidental (unintentional), initial encounter: Secondary | ICD-10-CM

## 2014-02-09 DIAGNOSIS — T6391XA Toxic effect of contact with unspecified venomous animal, accidental (unintentional), initial encounter: Secondary | ICD-10-CM

## 2014-02-09 MED ORDER — RANITIDINE HCL 150 MG PO TABS: 150.0000 mg | ORAL_TABLET | Freq: Two times a day (BID) | ORAL | Status: DC

## 2014-02-09 MED ORDER — CETIRIZINE HCL 10 MG PO TABS: 10.0000 mg | ORAL_TABLET | Freq: Every day | ORAL | Status: DC

## 2014-02-09 NOTE — Patient Instructions (Signed)
Ice locally Benadryl

## 2014-02-09 NOTE — Progress Notes (Signed)
   Subjective:    Patient ID: Audrey [name], female    DOB: Jul 17, 1961, 53 y.o.   MRN: [id number]  HPI  Pt presents to clinic with wasp sting to her left forearm about 30 hours ago.  She is worried because the area is red and it got bigger today and she started to swell around the wrist - she took off her watch and she noticed the redness and swelling got better.  She took benadryl last pm and this am, she has done nothing else to the area.  She otherwise feels fine.  Review of Systems  Respiratory: Negative for cough, shortness of breath and wheezing.   Skin: Positive for wound.       Objective:   Physical Exam  Vitals reviewed. Constitutional: She is oriented to person, place, and time. She appears well-developed and well-nourished.  HENT:  Head: Normocephalic and atraumatic.  Right Ear: External ear normal.  Left Ear: External ear normal.  Cardiovascular: Normal rate, regular rhythm and normal heart sounds.   No murmur heard. Pulmonary/Chest: Effort normal and breath sounds normal. She has no wheezes.  Neurological: She is alert and oriented to person, place, and time.  Skin: Skin is warm and dry. There is erythema.     Psychiatric: She has a normal mood and affect. Her behavior is normal. Judgment and thought content normal.      Assessment & Plan:  Bee sting with local allergic reaction with erythema and swelling - We will treat symptomatically and she will watch for increase in erythema and pain. Plan: cetirizine (ZYRTEC) 10 MG tablet, ranitidine (ZANTAC) 150 MG tablet (4 pills of each were given to the patient in the office)  Windell Hummingbird PA-C  [facility] 02/09/2014 7:05 PM

## 2014-05-15 ENCOUNTER — Encounter: Payer: Self-pay | Admitting: Obstetrics and Gynecology

## 2014-06-26 ENCOUNTER — Other Ambulatory Visit (HOSPITAL_COMMUNITY): Payer: Self-pay | Admitting: Family Medicine

## 2014-06-26 DIAGNOSIS — Z1231 Encounter for screening mammogram for malignant neoplasm of breast: Secondary | ICD-10-CM

## 2014-07-03 ENCOUNTER — Ambulatory Visit (HOSPITAL_COMMUNITY)
Admission: RE | Admit: 2014-07-03 | Discharge: 2014-07-03 | Disposition: A | Discharge status: Home / Self Care | Payer: 59 | Source: Ambulatory Visit | Attending: Family Medicine | Admitting: Family Medicine

## 2014-07-03 DIAGNOSIS — Z1231 Encounter for screening mammogram for malignant neoplasm of breast: Secondary | ICD-10-CM | POA: Insufficient documentation

## 2014-08-17 ENCOUNTER — Encounter: Payer: Self-pay | Admitting: Obstetrics and Gynecology

## 2014-08-17 ENCOUNTER — Ambulatory Visit (INDEPENDENT_AMBULATORY_CARE_PROVIDER_SITE_OTHER): Payer: 59 | Admitting: Obstetrics and Gynecology

## 2014-08-17 ENCOUNTER — Ambulatory Visit: Payer: 59 | Admitting: Obstetrics and Gynecology

## 2014-08-17 VITALS — BP 118/70 | HR 70 | Resp 14 | Ht 63.5 in | Wt 178.2 lb

## 2014-08-17 DIAGNOSIS — Z01419 Encounter for gynecological examination (general) (routine) without abnormal findings: Secondary | ICD-10-CM

## 2014-08-17 DIAGNOSIS — Z0000 Encounter for general adult medical examination without abnormal findings: Secondary | ICD-10-CM

## 2014-08-17 LAB — POCT URINALYSIS DIPSTICK
BILIRUBIN UA: NEGATIVE
GLUCOSE UA: NEGATIVE
Ketones, UA: NEGATIVE
LEUKOCYTES UA: NEGATIVE
NITRITE UA: NEGATIVE
PH UA: 5
Protein, UA: NEGATIVE
RBC UA: NEGATIVE
UROBILINOGEN UA: NEGATIVE
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
## 2014-08-17 NOTE — Patient Instructions (Signed)

## 2015-07-27 ENCOUNTER — Other Ambulatory Visit: Payer: Self-pay | Admitting: Family Medicine

## 2015-07-27 ENCOUNTER — Other Ambulatory Visit: Payer: Self-pay

## 2015-07-27 DIAGNOSIS — R1084 Generalized abdominal pain: Secondary | ICD-10-CM

## 2015-07-27 DIAGNOSIS — Z1231 Encounter for screening mammogram for malignant neoplasm of breast: Secondary | ICD-10-CM

## 2015-07-28 ENCOUNTER — Ambulatory Visit
Admission: RE | Admit: 2015-07-28 | Discharge: 2015-07-28 | Disposition: A | Discharge status: Home / Self Care | Payer: 59 | Source: Ambulatory Visit | Attending: Family Medicine | Admitting: Family Medicine

## 2015-07-28 ENCOUNTER — Ambulatory Visit
Admission: RE | Admit: 2015-07-28 | Discharge: 2015-07-28 | Disposition: A | Discharge status: Home / Self Care | Payer: 59 | Source: Ambulatory Visit

## 2015-07-28 DIAGNOSIS — Z1231 Encounter for screening mammogram for malignant neoplasm of breast: Secondary | ICD-10-CM

## 2015-07-28 DIAGNOSIS — R1084 Generalized abdominal pain: Secondary | ICD-10-CM

## 2015-09-08 ENCOUNTER — Encounter: Payer: Self-pay | Admitting: Obstetrics and Gynecology

## 2015-09-08 ENCOUNTER — Ambulatory Visit (INDEPENDENT_AMBULATORY_CARE_PROVIDER_SITE_OTHER): Payer: 59 | Admitting: Obstetrics and Gynecology

## 2015-09-08 VITALS — BP 110/70 | Resp 18 | Ht 63.75 in | Wt 189.0 lb

## 2015-09-08 DIAGNOSIS — Z01419 Encounter for gynecological examination (general) (routine) without abnormal findings: Secondary | ICD-10-CM | POA: Diagnosis not present
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
## 2015-09-08 NOTE — Patient Instructions (Signed)

## 2015-09-13 ENCOUNTER — Ambulatory Visit: Payer: 59 | Admitting: Obstetrics and Gynecology

## 2015-12-10 ENCOUNTER — Other Ambulatory Visit: Payer: Self-pay | Admitting: Gastroenterology

## 2015-12-10 DIAGNOSIS — D122 Benign neoplasm of ascending colon: Secondary | ICD-10-CM | POA: Diagnosis not present

## 2015-12-10 DIAGNOSIS — Z86010 Personal history of colonic polyps: Secondary | ICD-10-CM | POA: Diagnosis not present

## 2015-12-10 DIAGNOSIS — D126 Benign neoplasm of colon, unspecified: Secondary | ICD-10-CM | POA: Diagnosis not present

## 2016-07-26 ENCOUNTER — Other Ambulatory Visit: Payer: Self-pay | Admitting: Family Medicine

## 2016-07-26 DIAGNOSIS — E559 Vitamin D deficiency, unspecified: Secondary | ICD-10-CM | POA: Diagnosis not present

## 2016-07-26 DIAGNOSIS — Z0000 Encounter for general adult medical examination without abnormal findings: Secondary | ICD-10-CM | POA: Diagnosis not present

## 2016-07-26 DIAGNOSIS — E7800 Pure hypercholesterolemia, unspecified: Secondary | ICD-10-CM | POA: Diagnosis not present

## 2016-07-26 DIAGNOSIS — Z1231 Encounter for screening mammogram for malignant neoplasm of breast: Secondary | ICD-10-CM

## 2016-08-18 ENCOUNTER — Ambulatory Visit
Admission: RE | Admit: 2016-08-18 | Discharge: 2016-08-18 | Disposition: A | Discharge status: Home / Self Care | Payer: 59 | Source: Ambulatory Visit | Attending: Family Medicine | Admitting: Family Medicine

## 2016-08-18 DIAGNOSIS — Z1231 Encounter for screening mammogram for malignant neoplasm of breast: Secondary | ICD-10-CM

## 2016-09-04 DIAGNOSIS — M7500 Adhesive capsulitis of unspecified shoulder: Secondary | ICD-10-CM

## 2016-09-04 HISTORY — DX: Adhesive capsulitis of unspecified shoulder: M75.00
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
## 2016-09-27 ENCOUNTER — Ambulatory Visit (INDEPENDENT_AMBULATORY_CARE_PROVIDER_SITE_OTHER): Payer: 59 | Admitting: Obstetrics and Gynecology

## 2016-09-27 ENCOUNTER — Encounter: Payer: Self-pay | Admitting: Obstetrics and Gynecology

## 2016-09-27 VITALS — BP 100/60 | HR 60 | Ht 63.0 in | Wt 160.6 lb

## 2016-09-27 DIAGNOSIS — Z975 Presence of (intrauterine) contraceptive device: Secondary | ICD-10-CM

## 2016-09-27 DIAGNOSIS — Z01419 Encounter for gynecological examination (general) (routine) without abnormal findings: Secondary | ICD-10-CM | POA: Diagnosis not present

## 2016-09-27 DIAGNOSIS — Z1151 Encounter for screening for human papillomavirus (HPV): Secondary | ICD-10-CM | POA: Diagnosis not present

## 2016-09-27 NOTE — Patient Instructions (Signed)

## 2016-09-29 LAB — IPS PAP TEST WITH HPV

## 2017-02-06 ENCOUNTER — Other Ambulatory Visit (HOSPITAL_COMMUNITY): Payer: Self-pay | Admitting: Orthopedic Surgery

## 2017-02-06 DIAGNOSIS — M67911 Unspecified disorder of synovium and tendon, right shoulder: Secondary | ICD-10-CM

## 2017-02-19 ENCOUNTER — Ambulatory Visit (HOSPITAL_COMMUNITY)
Admission: RE | Admit: 2017-02-19 | Discharge: 2017-02-19 | Disposition: A | Discharge status: Home / Self Care | Payer: PRIVATE HEALTH INSURANCE | Source: Ambulatory Visit | Attending: Orthopedic Surgery | Admitting: Orthopedic Surgery

## 2017-02-19 DIAGNOSIS — M67911 Unspecified disorder of synovium and tendon, right shoulder: Secondary | ICD-10-CM | POA: Insufficient documentation

## 2017-02-19 DIAGNOSIS — X58XXXA Exposure to other specified factors, initial encounter: Secondary | ICD-10-CM | POA: Diagnosis not present

## 2017-02-19 DIAGNOSIS — S43491A Other sprain of right shoulder joint, initial encounter: Secondary | ICD-10-CM | POA: Insufficient documentation

## 2017-03-01 ENCOUNTER — Ambulatory Visit: Payer: PRIVATE HEALTH INSURANCE | Attending: Orthopedic Surgery | Admitting: Physical Therapy

## 2017-03-01 ENCOUNTER — Encounter: Payer: Self-pay | Admitting: Physical Therapy

## 2017-03-01 DIAGNOSIS — G8929 Other chronic pain: Secondary | ICD-10-CM | POA: Diagnosis present

## 2017-03-01 DIAGNOSIS — M25611 Stiffness of right shoulder, not elsewhere classified: Secondary | ICD-10-CM | POA: Insufficient documentation

## 2017-03-01 DIAGNOSIS — M25511 Pain in right shoulder: Secondary | ICD-10-CM | POA: Diagnosis present

## 2017-03-01 DIAGNOSIS — M6281 Muscle weakness (generalized): Secondary | ICD-10-CM | POA: Diagnosis present
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
## 2017-03-01 NOTE — Patient Instructions (Signed)

## 2017-03-08 ENCOUNTER — Ambulatory Visit: Payer: PRIVATE HEALTH INSURANCE | Attending: Orthopedic Surgery | Admitting: Physical Therapy

## 2017-03-08 ENCOUNTER — Encounter: Payer: Self-pay | Admitting: Physical Therapy

## 2017-03-08 DIAGNOSIS — M25511 Pain in right shoulder: Secondary | ICD-10-CM | POA: Insufficient documentation

## 2017-03-08 DIAGNOSIS — M6281 Muscle weakness (generalized): Secondary | ICD-10-CM | POA: Diagnosis present

## 2017-03-08 DIAGNOSIS — M25611 Stiffness of right shoulder, not elsewhere classified: Secondary | ICD-10-CM | POA: Diagnosis present

## 2017-03-08 DIAGNOSIS — G8929 Other chronic pain: Secondary | ICD-10-CM | POA: Diagnosis present

## 2017-03-09 ENCOUNTER — Encounter: Payer: Self-pay | Admitting: Physical Therapy
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
## 2017-03-14 ENCOUNTER — Ambulatory Visit: Payer: PRIVATE HEALTH INSURANCE | Admitting: Physical Therapy

## 2017-03-14 DIAGNOSIS — M25511 Pain in right shoulder: Secondary | ICD-10-CM | POA: Diagnosis not present

## 2017-03-14 DIAGNOSIS — G8929 Other chronic pain: Secondary | ICD-10-CM

## 2017-03-14 DIAGNOSIS — M25611 Stiffness of right shoulder, not elsewhere classified: Secondary | ICD-10-CM

## 2017-03-14 DIAGNOSIS — M6281 Muscle weakness (generalized): Secondary | ICD-10-CM

## 2017-03-15 ENCOUNTER — Encounter: Payer: Self-pay | Admitting: Physical Therapy

## 2017-03-15 ENCOUNTER — Ambulatory Visit: Payer: PRIVATE HEALTH INSURANCE | Admitting: Physical Therapy

## 2017-03-15 DIAGNOSIS — M25511 Pain in right shoulder: Secondary | ICD-10-CM | POA: Diagnosis not present

## 2017-03-15 DIAGNOSIS — M6281 Muscle weakness (generalized): Secondary | ICD-10-CM

## 2017-03-15 DIAGNOSIS — G8929 Other chronic pain: Secondary | ICD-10-CM

## 2017-03-15 DIAGNOSIS — M25611 Stiffness of right shoulder, not elsewhere classified: Secondary | ICD-10-CM
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
## 2017-03-20 ENCOUNTER — Encounter: Payer: Self-pay | Admitting: Physical Therapy

## 2017-03-20 ENCOUNTER — Ambulatory Visit: Payer: PRIVATE HEALTH INSURANCE | Admitting: Physical Therapy

## 2017-03-20 DIAGNOSIS — G8929 Other chronic pain: Secondary | ICD-10-CM

## 2017-03-20 DIAGNOSIS — M25511 Pain in right shoulder: Secondary | ICD-10-CM | POA: Diagnosis not present

## 2017-03-20 DIAGNOSIS — M25611 Stiffness of right shoulder, not elsewhere classified: Secondary | ICD-10-CM

## 2017-03-20 DIAGNOSIS — M6281 Muscle weakness (generalized): Secondary | ICD-10-CM
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
## 2017-03-22 ENCOUNTER — Ambulatory Visit: Payer: PRIVATE HEALTH INSURANCE | Attending: Orthopedic Surgery | Admitting: Physical Therapy

## 2017-03-22 DIAGNOSIS — M25611 Stiffness of right shoulder, not elsewhere classified: Secondary | ICD-10-CM | POA: Diagnosis present

## 2017-03-22 DIAGNOSIS — G8929 Other chronic pain: Secondary | ICD-10-CM | POA: Diagnosis present

## 2017-03-22 DIAGNOSIS — M6281 Muscle weakness (generalized): Secondary | ICD-10-CM | POA: Diagnosis present

## 2017-03-22 DIAGNOSIS — M25511 Pain in right shoulder: Secondary | ICD-10-CM | POA: Diagnosis not present

## 2017-03-23 ENCOUNTER — Encounter: Payer: Self-pay | Admitting: Physical Therapy
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
## 2017-03-27 ENCOUNTER — Ambulatory Visit: Payer: PRIVATE HEALTH INSURANCE | Admitting: Physical Therapy

## 2017-03-29 ENCOUNTER — Encounter: Payer: Self-pay | Admitting: Physical Therapy

## 2017-03-29 ENCOUNTER — Ambulatory Visit: Payer: PRIVATE HEALTH INSURANCE | Attending: Orthopedic Surgery | Admitting: Physical Therapy

## 2017-03-29 DIAGNOSIS — M25511 Pain in right shoulder: Secondary | ICD-10-CM | POA: Insufficient documentation

## 2017-03-29 DIAGNOSIS — M25611 Stiffness of right shoulder, not elsewhere classified: Secondary | ICD-10-CM

## 2017-03-29 DIAGNOSIS — G8929 Other chronic pain: Secondary | ICD-10-CM | POA: Diagnosis present

## 2017-03-29 DIAGNOSIS — M6281 Muscle weakness (generalized): Secondary | ICD-10-CM
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
## 2017-04-03 ENCOUNTER — Ambulatory Visit: Payer: PRIVATE HEALTH INSURANCE | Admitting: Physical Therapy

## 2017-04-03 ENCOUNTER — Encounter: Payer: Self-pay | Admitting: Physical Therapy

## 2017-04-03 DIAGNOSIS — G8929 Other chronic pain: Secondary | ICD-10-CM

## 2017-04-03 DIAGNOSIS — M25611 Stiffness of right shoulder, not elsewhere classified: Secondary | ICD-10-CM

## 2017-04-03 DIAGNOSIS — M25511 Pain in right shoulder: Principal | ICD-10-CM

## 2017-04-03 DIAGNOSIS — M6281 Muscle weakness (generalized): Secondary | ICD-10-CM
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
## 2017-04-09 ENCOUNTER — Ambulatory Visit: Payer: PRIVATE HEALTH INSURANCE | Attending: Family Medicine | Admitting: Physical Therapy

## 2017-04-09 ENCOUNTER — Encounter: Payer: Self-pay | Admitting: Physical Therapy

## 2017-04-09 DIAGNOSIS — M25511 Pain in right shoulder: Secondary | ICD-10-CM | POA: Insufficient documentation

## 2017-04-09 DIAGNOSIS — M6281 Muscle weakness (generalized): Secondary | ICD-10-CM | POA: Insufficient documentation

## 2017-04-09 DIAGNOSIS — G8929 Other chronic pain: Secondary | ICD-10-CM | POA: Insufficient documentation

## 2017-04-09 DIAGNOSIS — M25611 Stiffness of right shoulder, not elsewhere classified: Secondary | ICD-10-CM | POA: Insufficient documentation
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
## 2017-04-11 ENCOUNTER — Ambulatory Visit: Payer: Self-pay | Admitting: Physical Therapy

## 2017-04-13 ENCOUNTER — Encounter: Payer: Self-pay | Admitting: Physical Therapy

## 2017-04-19 ENCOUNTER — Ambulatory Visit: Payer: PRIVATE HEALTH INSURANCE | Attending: Family Medicine | Admitting: Physical Therapy

## 2017-04-19 ENCOUNTER — Encounter: Payer: Self-pay | Admitting: Physical Therapy

## 2017-04-19 DIAGNOSIS — M6281 Muscle weakness (generalized): Secondary | ICD-10-CM | POA: Insufficient documentation

## 2017-04-19 DIAGNOSIS — M25611 Stiffness of right shoulder, not elsewhere classified: Secondary | ICD-10-CM | POA: Insufficient documentation

## 2017-04-19 DIAGNOSIS — G8929 Other chronic pain: Secondary | ICD-10-CM | POA: Diagnosis present

## 2017-04-19 DIAGNOSIS — M25511 Pain in right shoulder: Secondary | ICD-10-CM | POA: Diagnosis present
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
## 2017-04-20 ENCOUNTER — Ambulatory Visit: Payer: PRIVATE HEALTH INSURANCE | Admitting: Physical Therapy

## 2017-04-20 ENCOUNTER — Encounter: Payer: Self-pay | Admitting: Physical Therapy

## 2017-04-20 DIAGNOSIS — G8929 Other chronic pain: Secondary | ICD-10-CM

## 2017-04-20 DIAGNOSIS — M25611 Stiffness of right shoulder, not elsewhere classified: Secondary | ICD-10-CM | POA: Diagnosis not present

## 2017-04-20 DIAGNOSIS — M25511 Pain in right shoulder: Secondary | ICD-10-CM

## 2017-04-20 DIAGNOSIS — M6281 Muscle weakness (generalized): Secondary | ICD-10-CM
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
## 2017-04-23 ENCOUNTER — Ambulatory Visit: Payer: PRIVATE HEALTH INSURANCE | Attending: Family Medicine | Admitting: Physical Therapy

## 2017-04-23 ENCOUNTER — Encounter: Payer: Self-pay | Admitting: Physical Therapy

## 2017-04-23 DIAGNOSIS — M25511 Pain in right shoulder: Secondary | ICD-10-CM | POA: Diagnosis present

## 2017-04-23 DIAGNOSIS — M25611 Stiffness of right shoulder, not elsewhere classified: Secondary | ICD-10-CM | POA: Diagnosis present

## 2017-04-23 DIAGNOSIS — M6281 Muscle weakness (generalized): Secondary | ICD-10-CM | POA: Diagnosis present

## 2017-04-23 DIAGNOSIS — G8929 Other chronic pain: Secondary | ICD-10-CM | POA: Insufficient documentation
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
## 2017-04-25 ENCOUNTER — Ambulatory Visit: Payer: PRIVATE HEALTH INSURANCE | Admitting: Physical Therapy

## 2017-04-25 DIAGNOSIS — M25611 Stiffness of right shoulder, not elsewhere classified: Secondary | ICD-10-CM

## 2017-04-25 DIAGNOSIS — G8929 Other chronic pain: Secondary | ICD-10-CM

## 2017-04-25 DIAGNOSIS — M6281 Muscle weakness (generalized): Secondary | ICD-10-CM

## 2017-04-25 DIAGNOSIS — M25511 Pain in right shoulder: Secondary | ICD-10-CM
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
## 2017-05-01 ENCOUNTER — Encounter: Payer: Self-pay | Admitting: Physical Therapy

## 2017-05-01 ENCOUNTER — Ambulatory Visit: Payer: PRIVATE HEALTH INSURANCE | Attending: Orthopedic Surgery | Admitting: Physical Therapy

## 2017-05-01 DIAGNOSIS — M25611 Stiffness of right shoulder, not elsewhere classified: Secondary | ICD-10-CM | POA: Diagnosis present

## 2017-05-01 DIAGNOSIS — M25511 Pain in right shoulder: Secondary | ICD-10-CM | POA: Diagnosis present

## 2017-05-01 DIAGNOSIS — M6281 Muscle weakness (generalized): Secondary | ICD-10-CM | POA: Diagnosis present

## 2017-05-01 DIAGNOSIS — G8929 Other chronic pain: Secondary | ICD-10-CM | POA: Insufficient documentation
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
## 2017-05-02 ENCOUNTER — Ambulatory Visit: Payer: PRIVATE HEALTH INSURANCE | Admitting: Physical Therapy

## 2017-05-02 ENCOUNTER — Encounter: Payer: Self-pay | Admitting: Physical Therapy

## 2017-05-02 DIAGNOSIS — G8929 Other chronic pain: Secondary | ICD-10-CM

## 2017-05-02 DIAGNOSIS — M25511 Pain in right shoulder: Secondary | ICD-10-CM

## 2017-05-02 DIAGNOSIS — M6281 Muscle weakness (generalized): Secondary | ICD-10-CM

## 2017-05-02 DIAGNOSIS — M25611 Stiffness of right shoulder, not elsewhere classified: Secondary | ICD-10-CM | POA: Diagnosis not present
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
## 2017-05-10 ENCOUNTER — Encounter: Payer: Self-pay | Admitting: Physical Therapy

## 2017-05-14 ENCOUNTER — Ambulatory Visit: Payer: PRIVATE HEALTH INSURANCE | Attending: Family Medicine | Admitting: Physical Therapy

## 2017-05-14 ENCOUNTER — Encounter: Payer: Self-pay | Admitting: Physical Therapy

## 2017-05-14 DIAGNOSIS — M25511 Pain in right shoulder: Secondary | ICD-10-CM | POA: Insufficient documentation

## 2017-05-14 DIAGNOSIS — G8929 Other chronic pain: Secondary | ICD-10-CM | POA: Diagnosis present

## 2017-05-14 DIAGNOSIS — M25611 Stiffness of right shoulder, not elsewhere classified: Secondary | ICD-10-CM | POA: Insufficient documentation

## 2017-05-14 DIAGNOSIS — M6281 Muscle weakness (generalized): Secondary | ICD-10-CM | POA: Diagnosis present
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
## 2017-05-28 ENCOUNTER — Ambulatory Visit: Payer: PRIVATE HEALTH INSURANCE | Attending: Orthopedic Surgery | Admitting: Physical Therapy

## 2017-05-28 ENCOUNTER — Encounter: Payer: Self-pay | Admitting: Physical Therapy

## 2017-05-28 DIAGNOSIS — G8929 Other chronic pain: Secondary | ICD-10-CM | POA: Insufficient documentation

## 2017-05-28 DIAGNOSIS — M25611 Stiffness of right shoulder, not elsewhere classified: Secondary | ICD-10-CM | POA: Diagnosis not present

## 2017-05-28 DIAGNOSIS — M25511 Pain in right shoulder: Secondary | ICD-10-CM | POA: Insufficient documentation

## 2017-05-28 DIAGNOSIS — M6281 Muscle weakness (generalized): Secondary | ICD-10-CM | POA: Diagnosis present
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
## 2017-05-30 ENCOUNTER — Encounter: Payer: Self-pay | Admitting: Physical Therapy

## 2017-05-30 ENCOUNTER — Ambulatory Visit: Payer: PRIVATE HEALTH INSURANCE | Admitting: Physical Therapy

## 2017-05-30 DIAGNOSIS — M25611 Stiffness of right shoulder, not elsewhere classified: Secondary | ICD-10-CM

## 2017-05-30 DIAGNOSIS — M6281 Muscle weakness (generalized): Secondary | ICD-10-CM

## 2017-05-30 DIAGNOSIS — G8929 Other chronic pain: Secondary | ICD-10-CM

## 2017-05-30 DIAGNOSIS — M25511 Pain in right shoulder: Secondary | ICD-10-CM
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
## 2017-06-04 ENCOUNTER — Encounter: Payer: Self-pay | Admitting: Physical Therapy

## 2017-06-04 ENCOUNTER — Ambulatory Visit: Payer: PRIVATE HEALTH INSURANCE | Attending: Family Medicine | Admitting: Physical Therapy

## 2017-06-04 DIAGNOSIS — M25611 Stiffness of right shoulder, not elsewhere classified: Secondary | ICD-10-CM | POA: Diagnosis not present

## 2017-06-04 DIAGNOSIS — G8929 Other chronic pain: Secondary | ICD-10-CM | POA: Insufficient documentation

## 2017-06-04 DIAGNOSIS — M6281 Muscle weakness (generalized): Secondary | ICD-10-CM | POA: Insufficient documentation

## 2017-06-04 DIAGNOSIS — M25511 Pain in right shoulder: Secondary | ICD-10-CM | POA: Insufficient documentation
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
## 2017-06-06 ENCOUNTER — Ambulatory Visit: Payer: PRIVATE HEALTH INSURANCE | Attending: Family Medicine | Admitting: Physical Therapy

## 2017-06-06 ENCOUNTER — Encounter: Payer: Self-pay | Admitting: Physical Therapy

## 2017-06-06 DIAGNOSIS — M6281 Muscle weakness (generalized): Secondary | ICD-10-CM | POA: Diagnosis present

## 2017-06-06 DIAGNOSIS — M25611 Stiffness of right shoulder, not elsewhere classified: Secondary | ICD-10-CM | POA: Insufficient documentation

## 2017-06-06 DIAGNOSIS — M25511 Pain in right shoulder: Secondary | ICD-10-CM | POA: Insufficient documentation

## 2017-06-06 DIAGNOSIS — G8929 Other chronic pain: Secondary | ICD-10-CM | POA: Diagnosis present
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
## 2017-06-11 ENCOUNTER — Ambulatory Visit: Payer: PRIVATE HEALTH INSURANCE | Attending: Orthopedic Surgery | Admitting: Physical Therapy

## 2017-06-11 ENCOUNTER — Encounter: Payer: Self-pay | Admitting: Physical Therapy

## 2017-06-11 ENCOUNTER — Telehealth: Payer: Self-pay | Admitting: Obstetrics and Gynecology

## 2017-06-11 DIAGNOSIS — M25511 Pain in right shoulder: Secondary | ICD-10-CM | POA: Insufficient documentation

## 2017-06-11 DIAGNOSIS — G8929 Other chronic pain: Secondary | ICD-10-CM | POA: Diagnosis present

## 2017-06-11 DIAGNOSIS — M25611 Stiffness of right shoulder, not elsewhere classified: Secondary | ICD-10-CM | POA: Insufficient documentation

## 2017-06-11 DIAGNOSIS — M6281 Muscle weakness (generalized): Secondary | ICD-10-CM | POA: Insufficient documentation

## 2017-06-11 NOTE — Telephone Encounter (Signed)
Call to patient about scheduling an appointment.
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
## 2017-06-13 ENCOUNTER — Encounter: Payer: Self-pay | Admitting: Physical Therapy

## 2017-06-13 ENCOUNTER — Ambulatory Visit: Payer: PRIVATE HEALTH INSURANCE | Attending: Orthopedic Surgery | Admitting: Physical Therapy

## 2017-06-13 DIAGNOSIS — M25511 Pain in right shoulder: Secondary | ICD-10-CM | POA: Insufficient documentation

## 2017-06-13 DIAGNOSIS — M25611 Stiffness of right shoulder, not elsewhere classified: Secondary | ICD-10-CM | POA: Insufficient documentation

## 2017-06-13 DIAGNOSIS — G8929 Other chronic pain: Secondary | ICD-10-CM | POA: Diagnosis not present

## 2017-06-13 DIAGNOSIS — M6281 Muscle weakness (generalized): Secondary | ICD-10-CM | POA: Insufficient documentation
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
## 2017-06-18 ENCOUNTER — Encounter: Payer: Self-pay | Admitting: Physical Therapy

## 2017-06-18 ENCOUNTER — Ambulatory Visit: Payer: PRIVATE HEALTH INSURANCE | Admitting: Physical Therapy

## 2017-06-18 DIAGNOSIS — M25611 Stiffness of right shoulder, not elsewhere classified: Secondary | ICD-10-CM | POA: Diagnosis not present

## 2017-06-18 DIAGNOSIS — M6281 Muscle weakness (generalized): Secondary | ICD-10-CM | POA: Diagnosis not present

## 2017-06-18 DIAGNOSIS — M25511 Pain in right shoulder: Secondary | ICD-10-CM

## 2017-06-18 DIAGNOSIS — G8929 Other chronic pain: Secondary | ICD-10-CM
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
## 2017-06-20 ENCOUNTER — Ambulatory Visit: Payer: PRIVATE HEALTH INSURANCE | Admitting: Physical Therapy

## 2017-06-20 ENCOUNTER — Encounter: Payer: Self-pay | Admitting: Physical Therapy

## 2017-06-20 DIAGNOSIS — M25511 Pain in right shoulder: Secondary | ICD-10-CM | POA: Diagnosis not present

## 2017-06-20 DIAGNOSIS — M6281 Muscle weakness (generalized): Secondary | ICD-10-CM

## 2017-06-20 DIAGNOSIS — G8929 Other chronic pain: Secondary | ICD-10-CM

## 2017-06-20 DIAGNOSIS — M25611 Stiffness of right shoulder, not elsewhere classified: Secondary | ICD-10-CM | POA: Diagnosis not present
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
## 2017-06-29 ENCOUNTER — Encounter: Payer: Self-pay | Admitting: Obstetrics and Gynecology

## 2017-06-29 ENCOUNTER — Ambulatory Visit (INDEPENDENT_AMBULATORY_CARE_PROVIDER_SITE_OTHER): Payer: 59 | Admitting: Obstetrics and Gynecology

## 2017-06-29 VITALS — BP 110/66 | HR 60 | Ht 63.0 in | Wt 194.0 lb

## 2017-06-29 DIAGNOSIS — Z30432 Encounter for removal of intrauterine contraceptive device: Secondary | ICD-10-CM

## 2017-06-29 DIAGNOSIS — Z8249 Family history of ischemic heart disease and other diseases of the circulatory system: Secondary | ICD-10-CM | POA: Diagnosis not present

## 2017-06-29 DIAGNOSIS — Z975 Presence of (intrauterine) contraceptive device: Secondary | ICD-10-CM

## 2017-06-29 DIAGNOSIS — N912 Amenorrhea, unspecified: Secondary | ICD-10-CM | POA: Diagnosis not present

## 2017-06-30 LAB — ESTRADIOL: Estradiol: <5 pg/mL

## 2017-06-30 LAB — FOLLICLE STIMULATING HORMONE: FSH: 69.4 m[IU]/mL

## 2017-07-05 ENCOUNTER — Ambulatory Visit: Payer: PRIVATE HEALTH INSURANCE | Attending: Orthopedic Surgery | Admitting: Physical Therapy

## 2017-07-05 ENCOUNTER — Encounter: Payer: Self-pay | Admitting: Physical Therapy

## 2017-07-05 DIAGNOSIS — G8929 Other chronic pain: Secondary | ICD-10-CM | POA: Insufficient documentation

## 2017-07-05 DIAGNOSIS — M25611 Stiffness of right shoulder, not elsewhere classified: Secondary | ICD-10-CM | POA: Insufficient documentation

## 2017-07-05 DIAGNOSIS — M6281 Muscle weakness (generalized): Secondary | ICD-10-CM | POA: Insufficient documentation

## 2017-07-05 DIAGNOSIS — M25511 Pain in right shoulder: Secondary | ICD-10-CM | POA: Diagnosis present
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
## 2017-07-12 ENCOUNTER — Encounter: Payer: Self-pay | Admitting: Physical Therapy

## 2017-07-12 ENCOUNTER — Ambulatory Visit: Payer: PRIVATE HEALTH INSURANCE | Admitting: Physical Therapy

## 2017-07-12 DIAGNOSIS — M6281 Muscle weakness (generalized): Secondary | ICD-10-CM

## 2017-07-12 DIAGNOSIS — G8929 Other chronic pain: Secondary | ICD-10-CM

## 2017-07-12 DIAGNOSIS — M25611 Stiffness of right shoulder, not elsewhere classified: Secondary | ICD-10-CM | POA: Diagnosis not present

## 2017-07-12 DIAGNOSIS — M25511 Pain in right shoulder: Secondary | ICD-10-CM
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
## 2017-07-16 ENCOUNTER — Ambulatory Visit: Payer: PRIVATE HEALTH INSURANCE | Attending: Orthopedic Surgery | Admitting: Physical Therapy

## 2017-07-16 ENCOUNTER — Encounter: Payer: Self-pay | Admitting: Physical Therapy

## 2017-07-16 DIAGNOSIS — M6281 Muscle weakness (generalized): Secondary | ICD-10-CM | POA: Diagnosis present

## 2017-07-16 DIAGNOSIS — G8929 Other chronic pain: Secondary | ICD-10-CM | POA: Insufficient documentation

## 2017-07-16 DIAGNOSIS — M25611 Stiffness of right shoulder, not elsewhere classified: Secondary | ICD-10-CM | POA: Insufficient documentation

## 2017-07-16 DIAGNOSIS — M25511 Pain in right shoulder: Secondary | ICD-10-CM | POA: Diagnosis present
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
## 2017-07-19 ENCOUNTER — Ambulatory Visit: Payer: PRIVATE HEALTH INSURANCE | Attending: Orthopedic Surgery | Admitting: Physical Therapy

## 2017-07-19 ENCOUNTER — Encounter: Payer: Self-pay | Admitting: Physical Therapy

## 2017-07-19 DIAGNOSIS — M25611 Stiffness of right shoulder, not elsewhere classified: Secondary | ICD-10-CM | POA: Insufficient documentation

## 2017-07-19 DIAGNOSIS — M6281 Muscle weakness (generalized): Secondary | ICD-10-CM | POA: Insufficient documentation

## 2017-07-19 DIAGNOSIS — G8929 Other chronic pain: Secondary | ICD-10-CM | POA: Insufficient documentation

## 2017-07-19 DIAGNOSIS — M25511 Pain in right shoulder: Secondary | ICD-10-CM | POA: Insufficient documentation
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
## 2017-07-31 ENCOUNTER — Encounter: Payer: Self-pay | Admitting: Physical Therapy

## 2017-07-31 ENCOUNTER — Ambulatory Visit: Payer: PRIVATE HEALTH INSURANCE | Attending: Family Medicine | Admitting: Physical Therapy

## 2017-07-31 DIAGNOSIS — M25611 Stiffness of right shoulder, not elsewhere classified: Secondary | ICD-10-CM | POA: Diagnosis present

## 2017-07-31 DIAGNOSIS — G8929 Other chronic pain: Secondary | ICD-10-CM | POA: Insufficient documentation

## 2017-07-31 DIAGNOSIS — M25511 Pain in right shoulder: Secondary | ICD-10-CM | POA: Insufficient documentation

## 2017-07-31 DIAGNOSIS — M6281 Muscle weakness (generalized): Secondary | ICD-10-CM | POA: Diagnosis present
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
## 2017-08-02 ENCOUNTER — Ambulatory Visit: Payer: PRIVATE HEALTH INSURANCE | Admitting: Physical Therapy

## 2017-08-02 DIAGNOSIS — G8929 Other chronic pain: Secondary | ICD-10-CM

## 2017-08-02 DIAGNOSIS — M6281 Muscle weakness (generalized): Secondary | ICD-10-CM

## 2017-08-02 DIAGNOSIS — M25611 Stiffness of right shoulder, not elsewhere classified: Secondary | ICD-10-CM

## 2017-08-02 DIAGNOSIS — M25511 Pain in right shoulder: Secondary | ICD-10-CM
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
## 2017-08-09 ENCOUNTER — Ambulatory Visit: Payer: PRIVATE HEALTH INSURANCE | Attending: Family Medicine | Admitting: Physical Therapy

## 2017-08-09 DIAGNOSIS — M25511 Pain in right shoulder: Secondary | ICD-10-CM | POA: Diagnosis present

## 2017-08-09 DIAGNOSIS — M6281 Muscle weakness (generalized): Secondary | ICD-10-CM | POA: Diagnosis present

## 2017-08-09 DIAGNOSIS — G8929 Other chronic pain: Secondary | ICD-10-CM | POA: Diagnosis present

## 2017-08-09 DIAGNOSIS — M25611 Stiffness of right shoulder, not elsewhere classified: Secondary | ICD-10-CM | POA: Diagnosis present
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
## 2017-08-14 ENCOUNTER — Encounter: Payer: Self-pay | Admitting: Physical Therapy

## 2017-08-14 ENCOUNTER — Ambulatory Visit: Payer: PRIVATE HEALTH INSURANCE | Attending: Orthopedic Surgery | Admitting: Physical Therapy

## 2017-08-14 DIAGNOSIS — G8929 Other chronic pain: Secondary | ICD-10-CM | POA: Diagnosis present

## 2017-08-14 DIAGNOSIS — M6281 Muscle weakness (generalized): Secondary | ICD-10-CM | POA: Diagnosis present

## 2017-08-14 DIAGNOSIS — M25511 Pain in right shoulder: Secondary | ICD-10-CM | POA: Diagnosis present

## 2017-08-14 DIAGNOSIS — M25611 Stiffness of right shoulder, not elsewhere classified: Secondary | ICD-10-CM | POA: Insufficient documentation
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
## 2017-09-21 ENCOUNTER — Telehealth: Payer: Self-pay | Admitting: Obstetrics and Gynecology

## 2017-09-21 ENCOUNTER — Other Ambulatory Visit: Payer: Self-pay | Admitting: Family Medicine

## 2017-09-21 DIAGNOSIS — Z1231 Encounter for screening mammogram for malignant neoplasm of breast: Secondary | ICD-10-CM

## 2017-09-21 NOTE — Telephone Encounter (Signed)
Left patient a message to call back to reschedule a future appointment that was cancelled by the provider for an AEX with Dr. Quincy Simmonds.

## 2017-09-27 ENCOUNTER — Ambulatory Visit: Payer: 59 | Admitting: Obstetrics and Gynecology

## 2017-10-19 ENCOUNTER — Ambulatory Visit
Admission: RE | Admit: 2017-10-19 | Discharge: 2017-10-19 | Disposition: A | Discharge status: Home / Self Care | Payer: No Typology Code available for payment source | Source: Ambulatory Visit | Attending: Family Medicine | Admitting: Family Medicine

## 2017-10-19 DIAGNOSIS — Z1231 Encounter for screening mammogram for malignant neoplasm of breast: Secondary | ICD-10-CM

## 2017-10-25 ENCOUNTER — Ambulatory Visit (INDEPENDENT_AMBULATORY_CARE_PROVIDER_SITE_OTHER): Payer: No Typology Code available for payment source | Admitting: Obstetrics and Gynecology

## 2017-10-25 ENCOUNTER — Encounter: Payer: Self-pay | Admitting: Obstetrics and Gynecology

## 2017-10-25 ENCOUNTER — Other Ambulatory Visit: Payer: Self-pay

## 2017-10-25 VITALS — BP 112/70 | HR 72 | Resp 16 | Ht 64.0 in | Wt 203.0 lb

## 2017-10-25 DIAGNOSIS — Z01419 Encounter for gynecological examination (general) (routine) without abnormal findings: Secondary | ICD-10-CM

## 2017-10-25 NOTE — Patient Instructions (Signed)
EXERCISE AND DIET:  We recommended that you start or continue a regular exercise program for good health. Regular exercise means any activity that makes your heart beat faster and makes you sweat.  We recommend exercising at least 30 minutes per day at least 3 days a week, preferably 4 or 5.  We also recommend a diet low in fat and sugar.  Inactivity, poor dietary choices and obesity can cause diabetes, heart attack, stroke, and kidney damage, among others.    ALCOHOL AND SMOKING:  Women should limit their alcohol intake to no more than 7 drinks/beers/glasses of wine (combined, not each!) per week. Moderation of alcohol intake to this level decreases your risk of breast cancer and liver damage. And of course, no recreational drugs are part of a healthy lifestyle.  And absolutely no smoking or even second hand smoke. Most people know smoking can cause heart and lung diseases, but did you know it also contributes to weakening of your bones? Aging of your skin?  Yellowing of your teeth and nails?  CALCIUM AND VITAMIN D:  Adequate intake of calcium and Vitamin D are recommended.  The recommendations for exact amounts of these supplements seem to change often, but generally speaking 600 mg of calcium (either carbonate or citrate) and 800 units of Vitamin D per day seems prudent. Certain women may benefit from higher intake of Vitamin D.  If you are among these women, your doctor will have told you during your visit.    PAP SMEARS:  Pap smears, to check for cervical cancer or precancers,  have traditionally been done yearly, although recent scientific advances have shown that most women can have pap smears less often.  However, every woman still should have a physical exam from her gynecologist every year. It will include a breast check, inspection of the vulva and vagina to check for abnormal growths or skin changes, a visual exam of the cervix, and then an exam to evaluate the size and shape of the uterus and  ovaries.  And after 55 years of age, a rectal exam is indicated to check for rectal cancers. We will also provide age appropriate advice regarding health maintenance, like when you should have certain vaccines, screening for sexually transmitted diseases, bone density testing, colonoscopy, mammograms, etc.   MAMMOGRAMS:  All women over 40 years old should have a yearly mammogram. Many facilities now offer a "3D" mammogram, which may cost around $50 extra out of pocket. If possible,  we recommend you accept the option to have the 3D mammogram performed.  It both reduces the number of women who will be called back for extra views which then turn out to be normal, and it is better than the routine mammogram at detecting truly abnormal areas.    COLONOSCOPY:  Colonoscopy to screen for colon cancer is recommended for all women at age 50.  We know, you hate the idea of the prep.  We agree, BUT, having colon cancer and not knowing it is worse!!  Colon cancer so often starts as a polyp that can be seen and removed at colonscopy, which can quite literally save your life!  And if your first colonoscopy is normal and you have no family history of colon cancer, most women don't have to have it again for 10 years.  Once every ten years, you can do something that may end up saving your life, right?  We will be happy to help you get it scheduled when you are ready.    Be sure to check your insurance coverage so you understand how much it will cost.  It may be covered as a preventative service at no cost, but you should check your particular policy.      Kegel Exercises Kegel exercises help strengthen the muscles that support the rectum, vagina, small intestine, bladder, and uterus. Doing Kegel exercises can help:  Improve bladder and bowel control.  Improve sexual response.  Reduce problems and discomfort during pregnancy.  Kegel exercises involve squeezing your pelvic floor muscles, which are the same muscles you  squeeze when you try to stop the flow of urine. The exercises can be done while sitting, standing, or lying down, but it is best to vary your position. Phase 1 exercises 1. Squeeze your pelvic floor muscles tight. You should feel a tight lift in your rectal area. If you are a female, you should also feel a tightness in your vaginal area. Keep your stomach, buttocks, and legs relaxed. 2. Hold the muscles tight for up to 10 seconds. 3. Relax your muscles. Repeat this exercise 50 times a day or as many times as told by your health care provider. Continue to do this exercise for at least 4-6 weeks or for as long as told by your health care provider. This information is not intended to replace advice given to you by your health care provider. Make sure you discuss any questions you have with your health care provider. Document Released: 08/07/2012 Document Revised: 04/15/2016 Document Reviewed: 07/11/2015 Elsevier Interactive Patient Education  2018 Elsevier Inc.  
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
## 2018-05-15 ENCOUNTER — Ambulatory Visit (INDEPENDENT_AMBULATORY_CARE_PROVIDER_SITE_OTHER): Payer: Self-pay | Admitting: Nurse Practitioner

## 2018-05-15 ENCOUNTER — Encounter: Payer: Self-pay | Admitting: Nurse Practitioner

## 2018-05-15 VITALS — BP 100/70 | HR 64 | Temp 98.5°F | Wt 172.0 lb

## 2018-05-15 DIAGNOSIS — L299 Pruritus, unspecified: Secondary | ICD-10-CM

## 2018-05-15 MED ORDER — TRIAMCINOLONE ACETONIDE 0.1 % EX CREA: 1.0000 "application " | TOPICAL_CREAM | Freq: Two times a day (BID) | CUTANEOUS | 0 refills | Status: AC

## 2018-05-15 NOTE — Patient Instructions (Signed)
Pruritus -Apply Triamcinolone cream to affected areas twice daily until symptoms improve. -Benadryl 25mg  at bedtime for itching. -Use Aveeno Colloidal Oatmeal bath for itching. -Keep skin moisturized. -Follow up as needed.  Pruritus is an itching feeling. There are many different conditions and factors that can make your skin itchy. Dry skin is one of the most common causes of itching. Most cases of itching do not require medical attention. Itchy skin can turn into a rash. Follow these instructions at home: Watch your pruritus for any changes. Take these steps to help with your condition: Skin Care  Moisturize your skin as needed. A moisturizer that contains petroleum jelly is best for keeping moisture in your skin.  Take or apply medicines only as directed by your health care provider. This may include: ? Corticosteroid cream. ? Anti-itch lotions. ? Oral anti-histamines.  Apply cool compresses to the affected areas.  Try taking a bath with: ? Epsom salts. Follow the instructions on the packaging. You can get these at your local pharmacy or grocery store. ? Baking soda. Pour a small amount into the bath as directed by your health care provider. ? Colloidal oatmeal. Follow the instructions on the packaging. You can get this at your local pharmacy or grocery store.  Try applying baking soda paste to your skin. Stir water into baking soda until it reaches a paste-like consistency.  Do not scratch your skin.  Avoid hot showers or baths, which can make itching worse. A cold shower may help with itching as long as you use a moisturizer after.  Avoid scented soaps, detergents, and perfumes. Use gentle soaps, detergents, perfumes, and other cosmetic products. General instructions  Avoid wearing tight clothes.  Keep a journal to help track what causes your itch. Write down: ? What you eat. ? What cosmetic products you use. ? What you drink. ? What you wear. This includes  jewelry.  Use a humidifier. This keeps the air moist, which helps to prevent dry skin. Contact a health care provider if:  The itching does not go away after several days.  You sweat at night.  You have weight loss.  You are unusually thirsty.  You urinate more than normal.  You are more tired than normal.  You have abdominal pain.  Your skin tingles.  You feel weak.  Your skin or the whites of your eyes look yellow (jaundice).  Your skin feels numb. This information is not intended to replace advice given to you by your health care provider. Make sure you discuss any questions you have with your health care provider. Document Released: 05/03/2011 Document Revised: 01/27/2016 Document Reviewed: 08/17/2014 Elsevier Interactive Patient Education  [employer].

## 2018-05-15 NOTE — Progress Notes (Signed)
Subjective:     Audrey [name] is a 57 y.o. female who presents for evaluation of a rash involving the chest and left scapula and bilateral groin. Patient states itching started 2 weeks ago. Patient states itching/rash has changed over time. Rash is pruritic. Associated symptoms: none. Patient denies: abdominal pain, fever, headache, nausea, sore throat and vomiting. Patient has not had contacts with similar rash. Patient has not had new exposures (soaps, lotions, laundry detergents, foods, medications, plants, insects or animals).  The patient states she stayed in a hotel about 2 weeks ago prior to the onset of her symptoms.  Patient is concerned that she has come into contact with bedbugs or lice.  The following portions of the patient's history were reviewed and updated as appropriate: allergies, current medications and past medical history.  Review of Systems Constitutional: negative Ears, nose, mouth, throat, and face: negative Respiratory: negative Cardiovascular: negative Gastrointestinal: negative Integument/breast: positive for pruritus, negative for rash, skin color change and skin lesion(s)    Objective:    BP 100/70   Pulse 64   Temp 98.5 F (36.9 C)   Wt 172 lb (78 kg)   LMP 07/04/2013   SpO2 99%   BMI 29.52 kg/m  General:  alert and cooperative  Skin:  ; no rash or abnormalities present, patient's head was checked thoroughly for lice, with no evidence of lice present     Assessment:    Pruritic Dermatitis    Plan:   Exam findings, diagnosis etiology and medication use and indications reviewed with patient. Follow- Up and discharge instructions provided. No emergent/urgent issues found on exam.  Patient education was provided.  Patient verbalized understanding of information provided and agrees with plan of care (POC), all questions answered. The patient is advised to call or return to clinic if he does not see an improvement in symptoms, or to seek the care of the  closest emergency department if he worsens with the above plan.   1. Pruritus  - triamcinolone cream (KENALOG) 0.1 %; Apply 1 application topically 2 (two) times daily for 14 days.  Dispense: 30 g; Refill: 0 -Apply Triamcinolone cream to affected areas twice daily until symptoms improve. -Benadryl 25mg  at bedtime for itching. -Use Aveeno Colloidal Oatmeal bath for itching. -Keep skin moisturized. -Follow up as needed.

## 2018-05-16 MED FILL — TRIAMCINOLONE 0.1% CREAM: 0.1 | 14 days supply | Qty: 30 | Fill #0

## 2018-09-04 HISTORY — PX: OTHER SURGICAL HISTORY: SHX169

## 2018-09-21 ENCOUNTER — Encounter

## 2018-10-18 ENCOUNTER — Other Ambulatory Visit: Payer: Self-pay | Admitting: Family Medicine

## 2018-10-18 DIAGNOSIS — Z1231 Encounter for screening mammogram for malignant neoplasm of breast: Secondary | ICD-10-CM

## 2018-10-18 MED FILL — EPINEPHRINE 0.3 MG AUTO-INJ: 0.3 | 30 days supply | Qty: 2 | Fill #0

## 2018-11-01 ENCOUNTER — Ambulatory Visit (INDEPENDENT_AMBULATORY_CARE_PROVIDER_SITE_OTHER): Payer: No Typology Code available for payment source | Admitting: Obstetrics and Gynecology

## 2018-11-01 ENCOUNTER — Other Ambulatory Visit: Payer: Self-pay

## 2018-11-01 ENCOUNTER — Encounter: Payer: Self-pay | Admitting: Obstetrics and Gynecology

## 2018-11-01 VITALS — BP 118/70 | HR 72 | Resp 16 | Ht 63.5 in | Wt 190.0 lb

## 2018-11-01 DIAGNOSIS — Z01419 Encounter for gynecological examination (general) (routine) without abnormal findings: Secondary | ICD-10-CM
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
## 2018-11-01 NOTE — Patient Instructions (Signed)

## 2018-11-14 ENCOUNTER — Other Ambulatory Visit: Payer: Self-pay

## 2018-11-14 ENCOUNTER — Ambulatory Visit
Admission: RE | Admit: 2018-11-14 | Discharge: 2018-11-14 | Disposition: A | Discharge status: Home / Self Care | Payer: No Typology Code available for payment source | Source: Ambulatory Visit | Attending: Family Medicine | Admitting: Family Medicine

## 2018-11-14 DIAGNOSIS — Z1231 Encounter for screening mammogram for malignant neoplasm of breast: Secondary | ICD-10-CM

## 2019-01-24 MED FILL — OMEPRAZOLE 40 MG CPDR: 40 | 90 days supply | Qty: 90 | Fill #0

## 2019-02-20 MED FILL — AMOXICILLIN 500 MG CAPSULE: 500 | 7 days supply | Qty: 21 | Fill #0

## 2019-02-20 MED FILL — CHLORHEXIDINE 0.12% RINSE: 0.12 | 13 days supply | Qty: 1 | Fill #0

## 2019-03-14 MED FILL — CHLORHEXIDINE 0.12% RINSE: 0.12 | 16 days supply | Qty: 473 | Fill #0

## 2019-10-02 ENCOUNTER — Telehealth: Payer: Self-pay | Admitting: Obstetrics and Gynecology

## 2019-10-02 NOTE — Telephone Encounter (Signed)
Left message on voicemail to call and reschedule cancelled appointment. °

## 2019-10-29 ENCOUNTER — Other Ambulatory Visit: Payer: Self-pay | Admitting: Family Medicine

## 2019-10-29 DIAGNOSIS — Z1231 Encounter for screening mammogram for malignant neoplasm of breast: Secondary | ICD-10-CM

## 2019-11-07 ENCOUNTER — Ambulatory Visit: Payer: No Typology Code available for payment source | Admitting: Obstetrics and Gynecology

## 2019-11-19 ENCOUNTER — Other Ambulatory Visit: Payer: Self-pay
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
## 2019-11-20 ENCOUNTER — Ambulatory Visit (INDEPENDENT_AMBULATORY_CARE_PROVIDER_SITE_OTHER): Payer: No Typology Code available for payment source | Admitting: Obstetrics and Gynecology

## 2019-11-20 ENCOUNTER — Encounter: Payer: Self-pay | Admitting: Obstetrics and Gynecology

## 2019-11-20 VITALS — BP 120/74 | HR 56 | Temp 97.0°F | Resp 14 | Ht 63.5 in | Wt 151.6 lb

## 2019-11-20 DIAGNOSIS — Z01419 Encounter for gynecological examination (general) (routine) without abnormal findings: Secondary | ICD-10-CM

## 2019-11-20 DIAGNOSIS — Z119 Encounter for screening for infectious and parasitic diseases, unspecified: Secondary | ICD-10-CM | POA: Diagnosis not present

## 2019-11-20 NOTE — Patient Instructions (Signed)

## 2019-11-21 LAB — HEPATITIS C ANTIBODY: Hep C Virus Ab: <0.1 s/co ratio (ref 0.0–0.9)

## 2019-11-26 ENCOUNTER — Ambulatory Visit
Admission: RE | Admit: 2019-11-26 | Discharge: 2019-11-26 | Disposition: A | Discharge status: Home / Self Care | Payer: No Typology Code available for payment source | Source: Ambulatory Visit | Attending: Family Medicine | Admitting: Family Medicine

## 2019-11-26 ENCOUNTER — Other Ambulatory Visit: Payer: Self-pay

## 2019-11-26 DIAGNOSIS — Z1231 Encounter for screening mammogram for malignant neoplasm of breast: Secondary | ICD-10-CM

## 2020-04-14 ENCOUNTER — Encounter: Payer: Self-pay | Admitting: Family Medicine

## 2020-04-14 ENCOUNTER — Ambulatory Visit (INDEPENDENT_AMBULATORY_CARE_PROVIDER_SITE_OTHER): Payer: No Typology Code available for payment source

## 2020-04-14 ENCOUNTER — Ambulatory Visit (INDEPENDENT_AMBULATORY_CARE_PROVIDER_SITE_OTHER): Payer: No Typology Code available for payment source | Admitting: Family Medicine

## 2020-04-14 ENCOUNTER — Other Ambulatory Visit: Payer: Self-pay

## 2020-04-14 DIAGNOSIS — M25561 Pain in right knee: Secondary | ICD-10-CM

## 2020-04-14 MED FILL — DICLOFENAC SODIUM 1 % GEL: 1 | 25 days supply | Qty: 400 | Fill #0
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
## 2020-04-14 NOTE — Patient Instructions (Signed)
   Vitamin D3:  5,000 IU daily  Vitamin K2:  100 mcg daily  Magnesium:  400 mg daily

## 2020-04-15 ENCOUNTER — Telehealth: Payer: Self-pay | Admitting: Family Medicine

## 2020-04-15 NOTE — Telephone Encounter (Signed)
[employer] forms received. Sent to [employer]. 

## 2020-04-19 ENCOUNTER — Ambulatory Visit: Payer: No Typology Code available for payment source | Admitting: Family Medicine

## 2020-04-27 ENCOUNTER — Ambulatory Visit (INDEPENDENT_AMBULATORY_CARE_PROVIDER_SITE_OTHER): Payer: No Typology Code available for payment source | Admitting: Family Medicine

## 2020-04-27 ENCOUNTER — Other Ambulatory Visit: Payer: Self-pay

## 2020-04-27 ENCOUNTER — Encounter: Payer: Self-pay | Admitting: Family Medicine

## 2020-04-27 ENCOUNTER — Ambulatory Visit: Payer: Self-pay

## 2020-04-27 DIAGNOSIS — M25561 Pain in right knee: Secondary | ICD-10-CM | POA: Diagnosis not present
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
## 2020-05-17 ENCOUNTER — Ambulatory Visit (INDEPENDENT_AMBULATORY_CARE_PROVIDER_SITE_OTHER): Payer: No Typology Code available for payment source

## 2020-05-17 ENCOUNTER — Ambulatory Visit (INDEPENDENT_AMBULATORY_CARE_PROVIDER_SITE_OTHER): Payer: No Typology Code available for payment source | Admitting: Family Medicine

## 2020-05-17 ENCOUNTER — Encounter: Payer: Self-pay | Admitting: Family Medicine

## 2020-05-17 ENCOUNTER — Other Ambulatory Visit: Payer: Self-pay | Admitting: Family Medicine

## 2020-05-17 ENCOUNTER — Other Ambulatory Visit: Payer: Self-pay

## 2020-05-17 DIAGNOSIS — M25561 Pain in right knee: Secondary | ICD-10-CM
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
## 2020-05-18 LAB — D-DIMER, QUANTITATIVE: D-Dimer, Quant: 0.45 mcg/mL FEU (ref <0.50)

## 2020-05-19 ENCOUNTER — Telehealth: Payer: Self-pay | Admitting: Family Medicine

## 2020-05-19 ENCOUNTER — Other Ambulatory Visit: Payer: Self-pay | Admitting: Family Medicine

## 2020-05-19 DIAGNOSIS — M25561 Pain in right knee: Secondary | ICD-10-CM

## 2020-05-19 NOTE — Telephone Encounter (Signed)
D-Dimer is normal.

## 2020-05-20 ENCOUNTER — Encounter (HOSPITAL_COMMUNITY): Payer: No Typology Code available for payment source

## 2020-05-23 ENCOUNTER — Ambulatory Visit (INDEPENDENT_AMBULATORY_CARE_PROVIDER_SITE_OTHER): Payer: No Typology Code available for payment source

## 2020-05-23 ENCOUNTER — Other Ambulatory Visit: Payer: No Typology Code available for payment source

## 2020-05-23 ENCOUNTER — Other Ambulatory Visit: Payer: Self-pay

## 2020-05-23 DIAGNOSIS — M25561 Pain in right knee: Secondary | ICD-10-CM | POA: Diagnosis not present

## 2020-05-24 ENCOUNTER — Telehealth: Payer: Self-pay | Admitting: Radiology

## 2020-05-24 ENCOUNTER — Telehealth: Payer: Self-pay | Admitting: Family Medicine

## 2020-05-24 ENCOUNTER — Ambulatory Visit (HOSPITAL_COMMUNITY)
Admission: RE | Admit: 2020-05-24 | Discharge: 2020-05-24 | Disposition: A | Discharge status: Home / Self Care | Payer: No Typology Code available for payment source | Source: Ambulatory Visit | Attending: Family Medicine | Admitting: Family Medicine

## 2020-05-24 DIAGNOSIS — M25561 Pain in right knee: Secondary | ICD-10-CM | POA: Insufficient documentation

## 2020-05-24 NOTE — Telephone Encounter (Signed)
Audrey [name] with cardiovascular at [facility] called report for Doppler. Patient is negative for DVT and they are holding patient there for any additional instructions from Dr. Junius Roads. Per Dr. Junius Roads, ok to let patient go. Erica advised.

## 2020-05-24 NOTE — Telephone Encounter (Signed)
No stress fracture seen on MRI.  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

## 2020-06-05 ENCOUNTER — Other Ambulatory Visit: Payer: No Typology Code available for payment source

## 2020-11-10 ENCOUNTER — Other Ambulatory Visit: Payer: Self-pay | Admitting: Obstetrics and Gynecology

## 2020-11-10 DIAGNOSIS — Z1231 Encounter for screening mammogram for malignant neoplasm of breast: Secondary | ICD-10-CM

## 2020-11-25 ENCOUNTER — Other Ambulatory Visit: Payer: Self-pay

## 2020-11-25 ENCOUNTER — Ambulatory Visit
Admission: RE | Admit: 2020-11-25 | Discharge: 2020-11-25 | Disposition: A | Discharge status: Home / Self Care | Payer: No Typology Code available for payment source | Source: Ambulatory Visit | Attending: Obstetrics and Gynecology | Admitting: Obstetrics and Gynecology

## 2020-11-25 DIAGNOSIS — Z1231 Encounter for screening mammogram for malignant neoplasm of breast: Secondary | ICD-10-CM

## 2020-12-02 ENCOUNTER — Telehealth: Payer: Self-pay | Admitting: Obstetrics and Gynecology

## 2020-12-02 ENCOUNTER — Encounter: Payer: Self-pay | Admitting: Obstetrics and Gynecology

## 2020-12-02 ENCOUNTER — Ambulatory Visit (INDEPENDENT_AMBULATORY_CARE_PROVIDER_SITE_OTHER): Payer: No Typology Code available for payment source | Admitting: Obstetrics and Gynecology

## 2020-12-02 ENCOUNTER — Other Ambulatory Visit: Payer: Self-pay

## 2020-12-02 VITALS — BP 104/60 | HR 67 | Ht 64.0 in | Wt 203.0 lb

## 2020-12-02 DIAGNOSIS — N6459 Other signs and symptoms in breast: Secondary | ICD-10-CM

## 2020-12-02 DIAGNOSIS — Z01419 Encounter for gynecological examination (general) (routine) without abnormal findings: Secondary | ICD-10-CM

## 2020-12-02 NOTE — Patient Instructions (Signed)
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
## 2020-12-02 NOTE — Telephone Encounter (Signed)
Please schedule a diagnostic right mammogram and right breast ultrasound for my patient at the [facility].  She has new onset right nipple inversion.  She just did her routine screening mammogram one week ago.  She is on the Focus Plan with [facility].  She has concerns that her PCP needs to make this appointment/referral for her.   Please contact Dr. Rachell Cipro if this is needed.

## 2020-12-03 ENCOUNTER — Other Ambulatory Visit: Payer: Self-pay | Admitting: Family Medicine

## 2020-12-03 ENCOUNTER — Other Ambulatory Visit: Payer: Self-pay | Admitting: Obstetrics and Gynecology

## 2020-12-03 DIAGNOSIS — N6459 Other signs and symptoms in breast: Secondary | ICD-10-CM

## 2020-12-03 NOTE — Telephone Encounter (Signed)
Call to patient. Patient unsure if PCP needs to order diagnostic MMG due to Focus Plan. RN advised would [facility]. RN spoke with Alyson Locket at [facility], who advised would have Dr. Ernie Hew order imaging as referring provider, but would ensure Dr. Quincy Simmonds got a copy of results. Advised scheduling out into May. Diagnostic imaging scheduled for 01-13-21 at 1500 per patient request of Thursday afternoon after 3pm. RN returned call to patient and advised patient of date and appointment time. Patient agreeable. Advised she could contact [facility] directly for cancellations. Patient also advised Dr. Ernie Hew would sign off on MMG order for insurance purposes. Patient agreeable.   Routing to provider and will close encounter.

## 2020-12-05 NOTE — Telephone Encounter (Signed)
I recommend trying to schedule diagnostic imaging sooner, even if it is [facility].

## 2021-01-10 ENCOUNTER — Other Ambulatory Visit: Payer: Self-pay | Admitting: Obstetrics and Gynecology

## 2021-01-10 DIAGNOSIS — N6459 Other signs and symptoms in breast: Secondary | ICD-10-CM

## 2021-01-13 ENCOUNTER — Other Ambulatory Visit: Payer: Self-pay

## 2021-01-13 ENCOUNTER — Ambulatory Visit
Admission: RE | Admit: 2021-01-13 | Discharge: 2021-01-13 | Disposition: A | Discharge status: Home / Self Care | Payer: No Typology Code available for payment source | Source: Ambulatory Visit | Attending: Family Medicine | Admitting: Family Medicine

## 2021-01-13 DIAGNOSIS — N6459 Other signs and symptoms in breast: Secondary | ICD-10-CM

## 2021-01-18 ENCOUNTER — Telehealth: Payer: Self-pay | Admitting: *Deleted

## 2021-01-18 ENCOUNTER — Telehealth: Payer: Self-pay

## 2021-01-18 DIAGNOSIS — N6459 Other signs and symptoms in breast: Secondary | ICD-10-CM

## 2021-01-18 NOTE — Telephone Encounter (Signed)
Patient had diagnostic breast imaging on 01/13/21. She said MRI was recommended and she needs you to order it so she can schedule. [facility] told her to call the office to check with you.  IMPRESSION: 1. Right nipple retraction without definite underlying mammographic or sonographic abnormality. Please note, sonographic evaluation is somewhat limited due to marked shadowing behind the nipple. 2. No suspicious right axillary lymphadenopathy   RECOMMENDATION: Recommendation is for further evaluation with contrast enhanced mass MRI, given limited ultrasound evaluation and the patient's new nipple retraction.  I have discussed the findings and recommendations with the patient. If applicable, a reminder letter will be sent to the patient regarding the next appointment.  BI-RADS CATEGORY  1: Negative.

## 2021-01-18 NOTE — Telephone Encounter (Signed)
-----   Message from Nunzio Cobbs, MD sent at 01/18/2021  1:05 PM EDT ----- Please contact patient in follow up to her diagnostic mammogram and ultrasound.  The radiologist is recommending a breast MRI with contrast due to right nipple retraction.   Please schedule this for patient.

## 2021-01-18 NOTE — Telephone Encounter (Signed)
I called patient and left a detailed message on patient voicemail she can call and schedule with [facility]. Patient is aware she needs MRI.

## 2021-01-18 NOTE — Telephone Encounter (Signed)
Per Anderson Malta she already has received order for MRI from Dr. Quincy Simmonds and is the process of arranging referral.

## 2021-01-24 NOTE — Telephone Encounter (Signed)
Patient scheduled for bilateral breast MRI w/wo contrast on 01/30/21 at [facility].   Will route to [facility] for prior approval

## 2021-01-30 ENCOUNTER — Ambulatory Visit
Admission: RE | Admit: 2021-01-30 | Discharge: 2021-01-30 | Disposition: A | Discharge status: Home / Self Care | Payer: No Typology Code available for payment source | Source: Ambulatory Visit | Attending: Obstetrics and Gynecology | Admitting: Obstetrics and Gynecology

## 2021-01-30 DIAGNOSIS — N6459 Other signs and symptoms in breast: Secondary | ICD-10-CM

## 2021-01-30 MED ORDER — GADOBUTROL 1 MMOL/ML IV SOLN
8.0000 mL | Freq: Once | INTRAVENOUS | Status: AC | PRN
  Administered 2021-01-30: 8 mL via INTRAVENOUS

## 2021-02-01 ENCOUNTER — Telehealth: Payer: Self-pay

## 2021-02-01 NOTE — Telephone Encounter (Signed)
Opened in error

## 2021-02-01 NOTE — Telephone Encounter (Signed)
Patient called for breast MRI results. Advised report not final yet. We will call her as soon as final and Dr. Quincy Simmonds reviews. She is welcome to call tomorrow to check on it.

## 2021-02-02 ENCOUNTER — Telehealth: Payer: Self-pay

## 2021-02-02 ENCOUNTER — Other Ambulatory Visit: Payer: Self-pay | Admitting: Obstetrics and Gynecology

## 2021-02-02 ENCOUNTER — Other Ambulatory Visit: Payer: Self-pay

## 2021-02-02 DIAGNOSIS — N6310 Unspecified lump in the right breast, unspecified quadrant: Secondary | ICD-10-CM

## 2021-02-02 DIAGNOSIS — R9389 Abnormal findings on diagnostic imaging of other specified body structures: Secondary | ICD-10-CM

## 2021-02-02 NOTE — Telephone Encounter (Signed)
Spoke with patient and informed her that Dr. Quincy Simmonds reviewed her result and agrees with MR guided biopsy. Order has been placed and they will be calling her to schedule the appointment.

## 2021-02-02 NOTE — Telephone Encounter (Signed)
MRI guided biopsy scheduled for Friday 02/11/21 at 7am.

## 2021-02-02 NOTE — Telephone Encounter (Signed)
Patient saw her breast MRI result in My Chart. She wants to know how she proceeds now.

## 2021-02-02 NOTE — Telephone Encounter (Signed)
Please schedule MRI guided right breast biopsy.  You can coordinate with [facility].   See also result note.

## 2021-02-02 NOTE — Telephone Encounter (Signed)
[facility](focus)-no auth req'd per rep Audrey H. ref number# [id number], 5/27 @ 10:16am.//csd PF:01/13/21 bcg  NO CYCLE: DX: inversion nipple Epic order WT:203 HT:5'4 NO COVID, NO needs/ NO Claus/NO TO ALL COVID PT AWARE CX FEE 01/19/21 DCP

## 2021-02-11 ENCOUNTER — Other Ambulatory Visit: Payer: Self-pay

## 2021-02-11 ENCOUNTER — Ambulatory Visit
Admission: RE | Admit: 2021-02-11 | Discharge: 2021-02-11 | Disposition: A | Discharge status: Home / Self Care | Payer: No Typology Code available for payment source | Source: Ambulatory Visit | Attending: Obstetrics and Gynecology | Admitting: Obstetrics and Gynecology

## 2021-02-11 ENCOUNTER — Other Ambulatory Visit: Payer: Self-pay | Admitting: Body Imaging

## 2021-02-11 ENCOUNTER — Encounter: Payer: Self-pay | Admitting: Obstetrics and Gynecology

## 2021-02-11 DIAGNOSIS — R9389 Abnormal findings on diagnostic imaging of other specified body structures: Secondary | ICD-10-CM

## 2021-02-11 DIAGNOSIS — N6310 Unspecified lump in the right breast, unspecified quadrant: Secondary | ICD-10-CM

## 2021-02-11 MED ORDER — GADOBUTROL 1 MMOL/ML IV SOLN
10.0000 mL | Freq: Once | INTRAVENOUS | Status: AC | PRN
  Administered 2021-02-11: 10 mL via INTRAVENOUS

## 2021-02-11 NOTE — Progress Notes (Signed)
MRI prior auth for MRI biopsy cpt code 46659 has been approved refer ance # [id number] valid 02/11/2021-03/13/2021

## 2021-02-18 ENCOUNTER — Telehealth: Payer: Self-pay | Admitting: *Deleted

## 2021-02-18 DIAGNOSIS — Z09 Encounter for follow-up examination after completed treatment for conditions other than malignant neoplasm: Secondary | ICD-10-CM

## 2021-02-18 DIAGNOSIS — N6310 Unspecified lump in the right breast, unspecified quadrant: Secondary | ICD-10-CM

## 2021-02-18 NOTE — Telephone Encounter (Signed)
-----   Message from Nunzio Cobbs, MD sent at 02/17/2021  9:47 PM EDT ----- Please contact patient in follow up to her benign breast biopsy.  She had a benign biopsy result.  Her evaluation was based on new onset nipple retraction of the right breast.   Please remove from current mammogram hold and place in recall for MRI in December, 2022.   I would like to recommend a general surgery consultation with [facility] for any additional recommendations.   Dr. Barry Dienes or Dr. Marlou Starks.

## 2021-02-18 NOTE — Telephone Encounter (Signed)
Encounter reviewed and closed.  

## 2021-02-18 NOTE — Telephone Encounter (Signed)
Patient informed with below note, patient said the radiologist spoke with about general surgery consultation as well and she does not wish to do this. Patient will follow up Mri Bx in Dec. 2022

## 2021-02-25 ENCOUNTER — Other Ambulatory Visit (HOSPITAL_COMMUNITY): Payer: Self-pay

## 2021-02-25 MED ORDER — ALLI 60 MG PO CAPS
ORAL_CAPSULE | Freq: Three times a day (TID) | ORAL | 3 refills | Status: DC
  Filled 2021-02-25: qty 90, 90d supply, fill #0

## 2021-02-25 MED ORDER — SAXENDA 18 MG/3ML ~~LOC~~ SOPN
PEN_INJECTOR | SUBCUTANEOUS | 0 refills | Status: DC
  Filled 2021-02-25 – 2021-03-02 (×2): qty 15, 30d supply, fill #0

## 2021-02-25 MED ORDER — WEGOVY 0.25 MG/0.5ML ~~LOC~~ SOAJ
0.2500 mg | SUBCUTANEOUS | 0 refills | Status: DC
  Filled 2021-02-25: qty 2, 28d supply, fill #0

## 2021-02-28 ENCOUNTER — Other Ambulatory Visit (HOSPITAL_COMMUNITY): Payer: Self-pay

## 2021-03-02 ENCOUNTER — Other Ambulatory Visit (HOSPITAL_COMMUNITY): Payer: Self-pay

## 2021-03-04 ENCOUNTER — Other Ambulatory Visit (HOSPITAL_COMMUNITY): Payer: Self-pay

## 2021-03-04 MED ORDER — UNIFINE PENTIPS 31G X 6 MM MISC
0 refills | Status: DC
  Filled 2021-03-04: qty 100, 90d supply, fill #0

## 2021-03-16 ENCOUNTER — Other Ambulatory Visit (HOSPITAL_COMMUNITY): Payer: Self-pay

## 2021-03-16 MED ORDER — NIRMATRELVIR & RITONAVIR 20 X 150 MG & 10 X 100MG PO TBPK
ORAL_TABLET | ORAL | 0 refills | Status: DC
  Filled 2021-03-16: qty 30, 5d supply, fill #0

## 2021-03-17 ENCOUNTER — Other Ambulatory Visit (HOSPITAL_COMMUNITY): Payer: Self-pay

## 2021-04-14 ENCOUNTER — Other Ambulatory Visit (HOSPITAL_COMMUNITY): Payer: Self-pay

## 2021-04-14 MED ORDER — ALCOHOL WIPES 70 % EX MISC: CUTANEOUS | 6 refills | Status: DC

## 2021-04-14 MED ORDER — SAXENDA 18 MG/3ML ~~LOC~~ SOPN
3.0000 mg | PEN_INJECTOR | Freq: Every day | SUBCUTANEOUS | 3 refills | Status: DC
  Filled 2021-04-14 – 2021-04-19 (×4): qty 15, 30d supply, fill #0
  Filled ????-??-??: fill #0

## 2021-04-14 MED ORDER — UNIFINE PENTIPS 32G X 4 MM MISC
6 refills | Status: DC
  Filled 2021-04-14 – 2021-04-19 (×4): qty 100, 90d supply, fill #0

## 2021-04-19 ENCOUNTER — Other Ambulatory Visit (HOSPITAL_COMMUNITY): Payer: Self-pay

## 2021-04-19 ENCOUNTER — Other Ambulatory Visit (HOSPITAL_BASED_OUTPATIENT_CLINIC_OR_DEPARTMENT_OTHER): Payer: Self-pay

## 2021-04-19 MED ORDER — NA SULFATE-K SULFATE-MG SULF 17.5-3.13-1.6 GM/177ML PO SOLN
ORAL | 0 refills | Status: DC
  Filled 2021-04-19: qty 354, 2d supply, fill #0

## 2021-04-30 ENCOUNTER — Telehealth: Payer: No Typology Code available for payment source | Admitting: Nurse Practitioner

## 2021-04-30 DIAGNOSIS — N3000 Acute cystitis without hematuria: Secondary | ICD-10-CM

## 2021-04-30 MED ORDER — CEPHALEXIN 500 MG PO CAPS
500.0000 mg | ORAL_CAPSULE | Freq: Two times a day (BID) | ORAL | 0 refills | Status: DC
  Filled 2021-04-30: qty 14, 7d supply, fill #0

## 2021-04-30 MED ORDER — CEPHALEXIN 500 MG PO CAPS: 500.0000 mg | ORAL_CAPSULE | Freq: Two times a day (BID) | ORAL | 0 refills | Status: DC

## 2021-04-30 NOTE — Progress Notes (Signed)
Virtual Visit Consent   Audrey [name], you are scheduled for a virtual visit with Mary-Margaret Hassell Done, [place], a [facility] provider, today.     Just as with appointments in the office, your consent must be obtained to participate.  Your consent will be active for this visit and any virtual visit you may have with one of our providers in the next 365 days.     If you have a MyChart account, a copy of this consent can be sent to you electronically.  All virtual visits are billed to your insurance company just like a traditional visit in the office.    As this is a virtual visit, video technology does not allow for your provider to perform a traditional examination.  This may limit your provider's ability to fully assess your condition.  If your provider identifies any concerns that need to be evaluated in person or the need to arrange testing (such as labs, EKG, etc.), we will make arrangements to do so.     Although advances in technology are sophisticated, we cannot ensure that it will always work on either your end or our end.  If the connection with a video visit is poor, the visit may have to be switched to a telephone visit.  With either a video or telephone visit, we are not always able to ensure that we have a secure connection.     I need to obtain your verbal consent now.   Are you willing to proceed with your visit today? YES   QUAYLA LUBITZ has provided verbal consent on 04/30/2021 for a virtual visit (video or telephone).   Mary-Margaret Hassell Done, FNP   Date: 04/30/2021 9:11 AM   Virtual Visit via Video Note   I, Mary-Margaret Hassell Done, connected with DEMARIAH [name] ([id number], [birth year]) on 04/30/21 at  9:15 AM EDT by a video-enabled telemedicine application and verified that I am speaking with the correct person using two identifiers.  Location: Patient: Virtual Visit Location Patient: Home Provider: Virtual Visit Location Provider: Mobile   I discussed the limitations of  evaluation and management by telemedicine and the availability of in person appointments. The patient expressed understanding and agreed to proceed.    History of Present Illness: Audrey [name] is a 60 y.o. who identifies as a female who was assigned female at birth, and is being seen today for UTI .  HPI: Patient states that she developed dysuria, urgency and frequency . When she urinates she only voids a small amount.   Review of Systems  Constitutional:  Positive for chills and fever (low grade fever).  Gastrointestinal:  Positive for abdominal pain.  Genitourinary:  Positive for dysuria, frequency and urgency. Negative for flank pain and hematuria.  Musculoskeletal:  Negative for back pain. .   Problems: There are no problems to display for this patient.   Allergies:  Allergies  Allergen Reactions   Avocado Shortness Of Breath   Codeine Nausea And Vomiting    Break out in rash   Other     Adhesive tape--"sensitivity"   Medications:  Current Outpatient Medications:    aspirin EC 81 MG tablet, Take 81 mg by mouth daily., Disp: , Rfl:    EPINEPHrine 0.3 mg/0.3 mL IJ SOAJ injection, as needed., Disp: , Rfl:    famotidine (PEPCID) 10 MG tablet, Take 10 mg by mouth daily., Disp: , Rfl:    Insulin Pen Needle (UNIFINE PENTIPS) 31G X 6 MM MISC, Use once daily,  Disp: 100 each, Rfl: 0   Insulin Pen Needle (UNIFINE PENTIPS) 32G X 4 MM MISC, Use daily with Saxenda, Disp: 100 each, Rfl: 6   Isopropyl Alcohol (ALCOHOL WIPES) 70 % MISC, use prior to Saxenda Injections daily, Disp: 1 each, Rfl: 6   Liraglutide -Weight Management (SAXENDA) 18 MG/3ML SOPN, Week 1: Inject and start 0.6 mg into the skin daily. Week 2: inject 1.2 mg daily. Week 3: Inject 1.8 mg daily. Week 4: Inject 2.4 mg daily. Then start full dose of 3.0 mg daily., Disp: 15 mL, Rfl: 0   Liraglutide -Weight Management (SAXENDA) 18 MG/3ML SOPN, Inject 3 mg into the skin daily., Disp: 15 mL, Rfl: 3   Multiple Vitamins-Minerals  (MULTIVITAMIN PO), Take by mouth every morning., Disp: , Rfl:    Na Sulfate-K Sulfate-Mg Sulf 17.5-3.13-1.6 GM/177ML SOLN, Take as directed, Disp: 354 mL, Rfl: 0   orlistat (ALLI) 60 MG capsule, Take 1 capsule by mouth three times a day with each meal, Disp: 90 capsule, Rfl: 3   Semaglutide-Weight Management (WEGOVY) 0.25 MG/0.5ML SOAJ, Inject 0.25 mg into the skin once a week., Disp: 2 mL, Rfl: 0  Observations/Objective: Patient is well-develope, well-nourished in no acute distress.  Resting comfortably  at home.  Head is normocephalic, atraumatic.  No labored breathing.  Speech is clear and coherent with logical content.  Patient is alert and oriented at baseline.    Assessment and Plan:  MELVENA SPORLEDER in today with chief complaint of Urinary Tract Infection   1. Acute cystitis without hematuria Take medication as prescribe Cotton underwear Take shower not bath Cranberry juice, yogurt Force fluids AZO over the counter X2 days RTO prn  Meds ordered this encounter  Medications   DISCONTD: cephALEXin (KEFLEX) 500 MG capsule    Sig: Take 1 capsule (500 mg total) by mouth 2 (two) times daily.    Dispense:  14 capsule    Refill:  0    Order Specific Question:   Supervising Provider    Answer:   MILLER, BRIAN [[id number]]   cephALEXin (KEFLEX) 500 MG capsule    Sig: Take 1 capsule (500 mg total) by mouth 2 (two) times daily.    Dispense:  14 capsule    Refill:  0    Order Specific Question:   Supervising Provider    Answer:   Noemi Chapel [[id number]]      Follow Up Instructions: I discussed the assessment and treatment plan with the patient. The patient was provided an opportunity to ask questions and all were answered. The patient agreed with the plan and demonstrated an understanding of the instructions.  A copy of instructions were sent to the patient via MyChart.  The patient was advised to call back or seek an in-person evaluation if the symptoms worsen or if the condition  fails to improve as anticipated.  Time:  I spent 10 minutes with the patient via telehealth technology discussing the above problems/concerns.    Mary-Margaret Hassell Done, FNP

## 2021-05-02 ENCOUNTER — Other Ambulatory Visit (HOSPITAL_COMMUNITY): Payer: Self-pay

## 2021-07-11 NOTE — Telephone Encounter (Signed)
Patient called back requesting order placed for MRI breast. Order placed number given to call and schedule.

## 2021-07-11 NOTE — Addendum Note (Signed)
Addended by: Thamas Jaegers on: 07/11/2021 09:23 AM   Modules accepted: Orders

## 2021-07-12 NOTE — Telephone Encounter (Signed)
Mri scheduled on 07/31/21 #3:20 pm at [facility] imaging. Will route to [place] to check for prior approval.

## 2021-07-31 ENCOUNTER — Ambulatory Visit
Admission: RE | Admit: 2021-07-31 | Discharge: 2021-07-31 | Disposition: A | Discharge status: Home / Self Care | Payer: No Typology Code available for payment source | Source: Ambulatory Visit | Attending: Obstetrics and Gynecology | Admitting: Obstetrics and Gynecology

## 2021-07-31 ENCOUNTER — Other Ambulatory Visit: Payer: Self-pay

## 2021-07-31 DIAGNOSIS — N6310 Unspecified lump in the right breast, unspecified quadrant: Secondary | ICD-10-CM

## 2021-07-31 DIAGNOSIS — Z09 Encounter for follow-up examination after completed treatment for conditions other than malignant neoplasm: Secondary | ICD-10-CM

## 2021-07-31 MED ORDER — GADOBUTROL 1 MMOL/ML IV SOLN
9.0000 mL | Freq: Once | INTRAVENOUS | Status: AC | PRN
  Administered 2021-07-31: 15:00:00 9 mL via INTRAVENOUS
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
## 2021-08-10 ENCOUNTER — Ambulatory Visit: Payer: No Typology Code available for payment source | Admitting: Obstetrics and Gynecology

## 2021-08-10 ENCOUNTER — Encounter: Payer: Self-pay | Admitting: Obstetrics and Gynecology

## 2021-08-10 ENCOUNTER — Other Ambulatory Visit: Payer: Self-pay

## 2021-08-10 VITALS — BP 102/62 | HR 76 | Resp 20

## 2021-08-10 DIAGNOSIS — Z9889 Other specified postprocedural states: Secondary | ICD-10-CM | POA: Diagnosis not present

## 2021-08-10 DIAGNOSIS — N6459 Other signs and symptoms in breast: Secondary | ICD-10-CM

## 2021-09-08 ENCOUNTER — Other Ambulatory Visit (HOSPITAL_BASED_OUTPATIENT_CLINIC_OR_DEPARTMENT_OTHER): Payer: Self-pay

## 2021-09-08 MED ORDER — AZELASTINE HCL 0.1 % NA SOLN
NASAL | 11 refills | Status: DC
  Filled 2021-09-08: qty 30, 50d supply, fill #0

## 2021-09-08 MED ORDER — BENZONATATE 200 MG PO CAPS
ORAL_CAPSULE | ORAL | 0 refills | Status: DC
  Filled 2021-09-08: qty 60, 20d supply, fill #0

## 2021-09-08 MED ORDER — ALBUTEROL SULFATE HFA 108 (90 BASE) MCG/ACT IN AERS
INHALATION_SPRAY | RESPIRATORY_TRACT | 0 refills | Status: DC
  Filled 2021-09-08: qty 8.5, 17d supply, fill #0

## 2021-09-08 MED ORDER — COVID-19 AT HOME ANTIGEN TEST VI KIT
PACK | 0 refills | Status: DC
  Filled 2021-09-08: qty 2, 4d supply, fill #0

## 2021-11-03 ENCOUNTER — Other Ambulatory Visit: Payer: Self-pay | Admitting: Obstetrics and Gynecology

## 2021-11-03 DIAGNOSIS — Z1231 Encounter for screening mammogram for malignant neoplasm of breast: Secondary | ICD-10-CM

## 2021-11-17 ENCOUNTER — Other Ambulatory Visit (HOSPITAL_BASED_OUTPATIENT_CLINIC_OR_DEPARTMENT_OTHER): Payer: Self-pay

## 2021-11-17 MED ORDER — EPINEPHRINE 0.3 MG/0.3ML IJ SOAJ
INTRAMUSCULAR | 1 refills | Status: AC
  Filled 2021-11-17: qty 4, 2d supply, fill #0

## 2021-12-01 ENCOUNTER — Ambulatory Visit
Admission: RE | Admit: 2021-12-01 | Discharge: 2021-12-01 | Disposition: A | Discharge status: Home / Self Care | Payer: No Typology Code available for payment source | Source: Ambulatory Visit | Attending: Obstetrics and Gynecology | Admitting: Obstetrics and Gynecology

## 2021-12-01 DIAGNOSIS — Z1231 Encounter for screening mammogram for malignant neoplasm of breast: Secondary | ICD-10-CM
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
## 2021-12-08 ENCOUNTER — Other Ambulatory Visit (HOSPITAL_COMMUNITY)
Admission: RE | Admit: 2021-12-08 | Discharge: 2021-12-08 | Disposition: A | Discharge status: Home / Self Care | Payer: No Typology Code available for payment source | Source: Ambulatory Visit | Attending: Obstetrics and Gynecology | Admitting: Obstetrics and Gynecology

## 2021-12-08 ENCOUNTER — Encounter: Payer: Self-pay | Admitting: Obstetrics and Gynecology

## 2021-12-08 ENCOUNTER — Ambulatory Visit (INDEPENDENT_AMBULATORY_CARE_PROVIDER_SITE_OTHER): Payer: No Typology Code available for payment source | Admitting: Obstetrics and Gynecology

## 2021-12-08 VITALS — BP 110/70 | HR 76 | Ht 62.5 in | Wt 205.0 lb

## 2021-12-08 DIAGNOSIS — Z124 Encounter for screening for malignant neoplasm of cervix: Secondary | ICD-10-CM | POA: Diagnosis present

## 2021-12-08 DIAGNOSIS — Z01419 Encounter for gynecological examination (general) (routine) without abnormal findings: Secondary | ICD-10-CM | POA: Diagnosis not present

## 2021-12-08 NOTE — Patient Instructions (Signed)

## 2021-12-12 LAB — CYTOLOGY - PAP
Comment: NEGATIVE
Diagnosis: NEGATIVE
High risk HPV: NEGATIVE

## 2022-11-15 ENCOUNTER — Other Ambulatory Visit: Payer: Self-pay | Admitting: Obstetrics and Gynecology

## 2022-11-15 DIAGNOSIS — Z0000 Encounter for general adult medical examination without abnormal findings: Secondary | ICD-10-CM

## 2022-12-08 DIAGNOSIS — Z13220 Encounter for screening for lipoid disorders: Secondary | ICD-10-CM | POA: Diagnosis not present

## 2022-12-08 DIAGNOSIS — Z114 Encounter for screening for human immunodeficiency virus [HIV]: Secondary | ICD-10-CM | POA: Diagnosis not present

## 2022-12-08 DIAGNOSIS — E782 Mixed hyperlipidemia: Secondary | ICD-10-CM | POA: Diagnosis not present

## 2022-12-08 DIAGNOSIS — Z0000 Encounter for general adult medical examination without abnormal findings: Secondary | ICD-10-CM | POA: Diagnosis not present

## 2022-12-14 DIAGNOSIS — Z0000 Encounter for general adult medical examination without abnormal findings: Secondary | ICD-10-CM | POA: Diagnosis not present

## 2022-12-21 ENCOUNTER — Ambulatory Visit: Payer: No Typology Code available for payment source | Admitting: Obstetrics and Gynecology

## 2022-12-28 ENCOUNTER — Ambulatory Visit
Admission: RE | Admit: 2022-12-28 | Discharge: 2022-12-28 | Disposition: A | Discharge status: Home / Self Care | Payer: 59 | Source: Ambulatory Visit | Attending: Obstetrics and Gynecology | Admitting: Obstetrics and Gynecology

## 2022-12-28 DIAGNOSIS — Z1231 Encounter for screening mammogram for malignant neoplasm of breast: Secondary | ICD-10-CM | POA: Diagnosis not present

## 2022-12-28 DIAGNOSIS — Z0000 Encounter for general adult medical examination without abnormal findings: Secondary | ICD-10-CM
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
## 2023-01-12 ENCOUNTER — Ambulatory Visit (INDEPENDENT_AMBULATORY_CARE_PROVIDER_SITE_OTHER): Payer: 59 | Admitting: Obstetrics and Gynecology

## 2023-01-12 ENCOUNTER — Encounter: Payer: Self-pay | Admitting: Obstetrics and Gynecology

## 2023-01-12 VITALS — BP 114/74 | HR 101 | Ht 63.25 in | Wt 202.0 lb

## 2023-01-12 DIAGNOSIS — Z01419 Encounter for gynecological examination (general) (routine) without abnormal findings: Secondary | ICD-10-CM | POA: Diagnosis not present

## 2023-01-12 NOTE — Patient Instructions (Signed)

## 2023-04-03 ENCOUNTER — Other Ambulatory Visit: Payer: Self-pay | Admitting: Oncology

## 2023-04-03 DIAGNOSIS — Z006 Encounter for examination for normal comparison and control in clinical research program: Secondary | ICD-10-CM

## 2023-04-04 ENCOUNTER — Other Ambulatory Visit (HOSPITAL_COMMUNITY)
Admission: RE | Admit: 2023-04-04 | Discharge: 2023-04-04 | Disposition: A | Discharge status: Home / Self Care | Payer: Self-pay | Source: Ambulatory Visit | Attending: Oncology | Admitting: Oncology

## 2023-04-04 DIAGNOSIS — Z006 Encounter for examination for normal comparison and control in clinical research program: Secondary | ICD-10-CM | POA: Insufficient documentation

## 2023-04-11 IMAGING — MR MR BREAST BILAT WO/W CM
8 of 12 series · 32 of 48 positions shown · IV contrast (9 ML Multihance)
Comparison: Previous examinations, including the right breast MR
guided core needle biopsy dated 02/11/2021, bilateral breast MRI
dated 02/01/2021, right diagnostic mammogram and right breast
ultrasound dated 01/13/2021 and bilateral screening mammogram dated
11/29/2020.

CLINICAL DATA: Follow-up MR guided core needle biopsy of linear non
mass enhancement in the retroareolar right breast performed on
02/11/2021 with benign results of ectatic ducts with focal fibrosis
and foamy histiocytes consistent with rupture. The initial MRI was
performed for right nipple retraction evaluated on 01/13/2021 with
no mammographic or sonographic explanation.

EXAM:
BILATERAL BREAST MRI WITH AND WITHOUT CONTRAST
TECHNIQUE: Multiplanar, multisequence MR images of both breasts were obtained
prior to and following the intravenous administration of 9 ml of
Gadavist

[Series 2: t2_tirm_tra ipat (a-p) · axial · 3.0mm · 0.70mm/px · 1 of 55 slices shown]
[im 1/55]
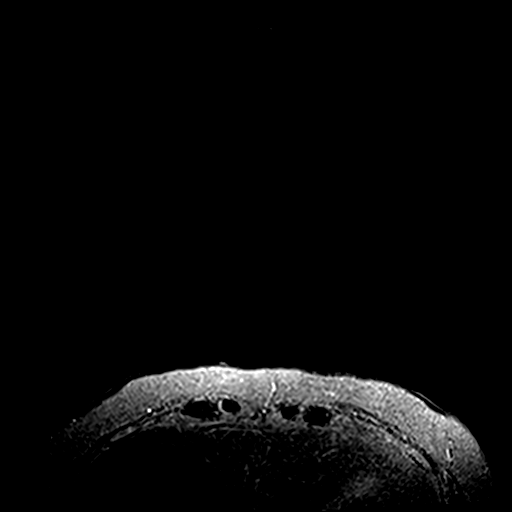

[Series 3: fl3d pre-cm no · axial · non-contrast · 1.2mm · 0.94mm/px · z∈[-78,+93]mm · 5 of 144 slices shown]
[im 1/144]
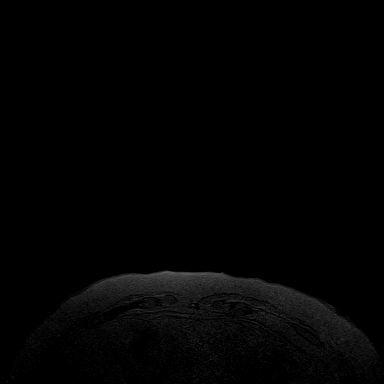
[im 36/144]
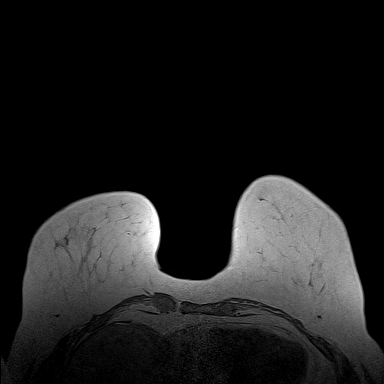
[im 72/144]
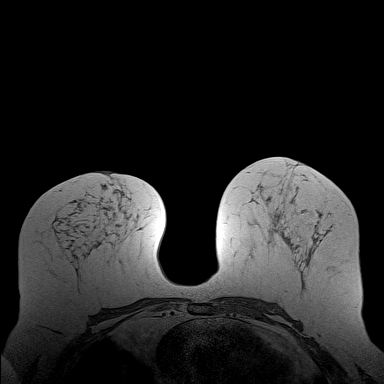
[im 108/144]
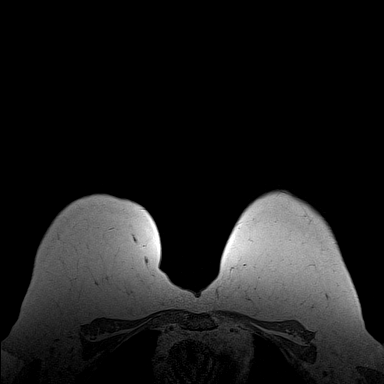
[im 144/144]
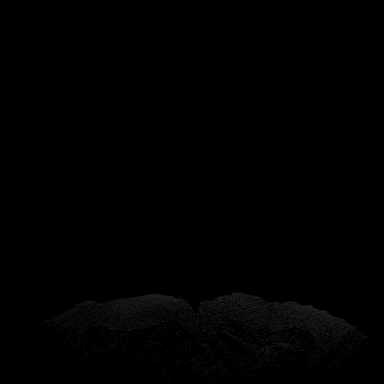

[Series 4: fl3d pre-cm · axial · non-contrast · 1.2mm · 0.94mm/px · z∈[-78,+93]mm · 5 of 144 slices shown]
[im 1/144]
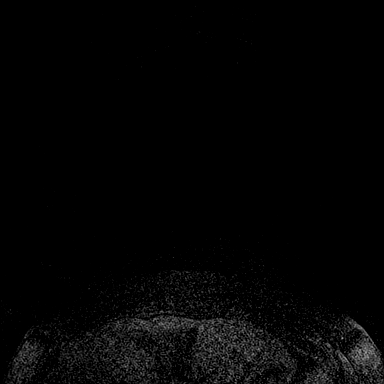
[im 36/144]
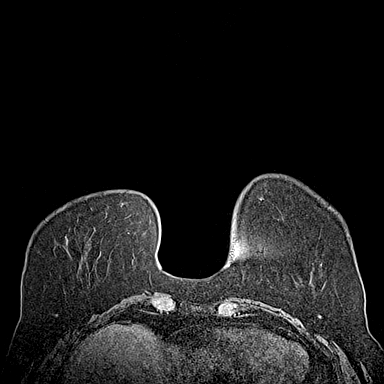
[im 72/144]
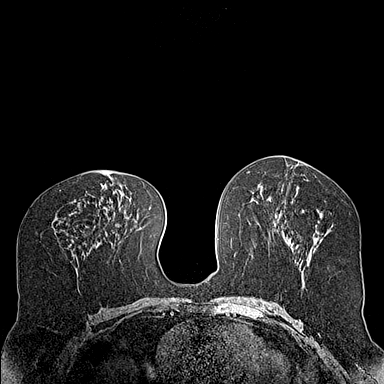
[im 108/144]
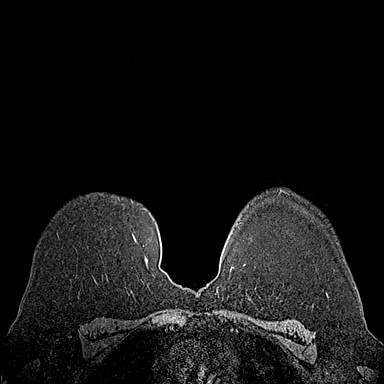
[im 144/144]
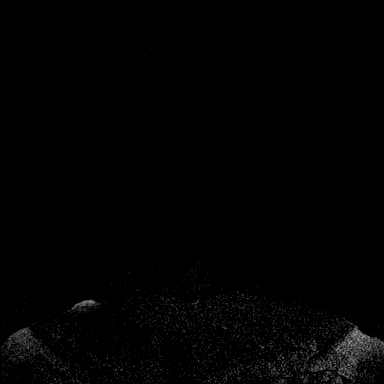

[Series 5: fl3d post-cm 20 · axial · 1.2mm · 0.94mm/px · z∈[-78,+93]mm · 5 of 144 slices shown (1 of 3)]
[im 1/144]
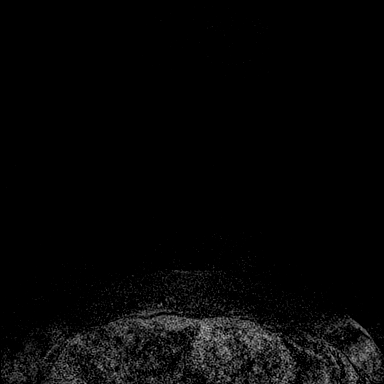
[im 36/144]
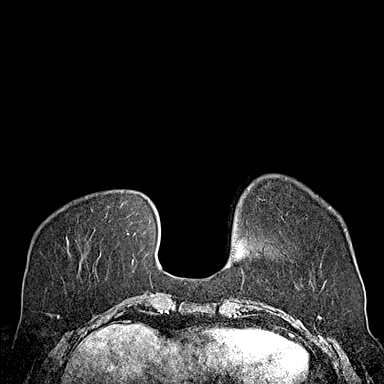
[im 72/144]
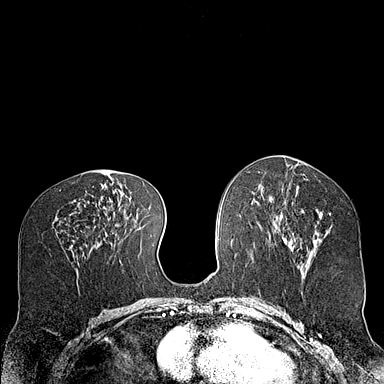
[im 108/144]
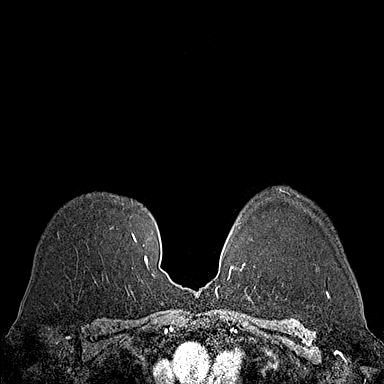
[im 144/144]
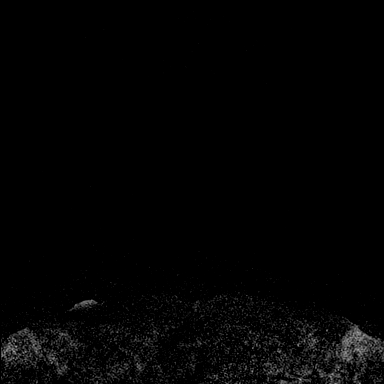

[Series 6: fl3d post-cm 20 · axial · 1.2mm · 0.94mm/px · z∈[-78,+93]mm · 5 of 144 slices shown (2 of 3)]
[im 1/144]
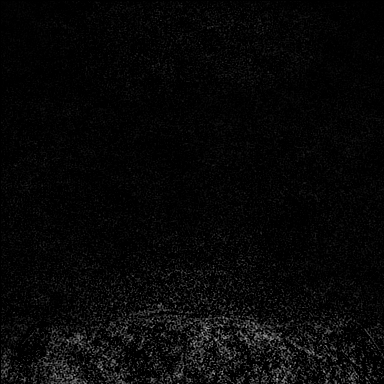
[im 36/144]
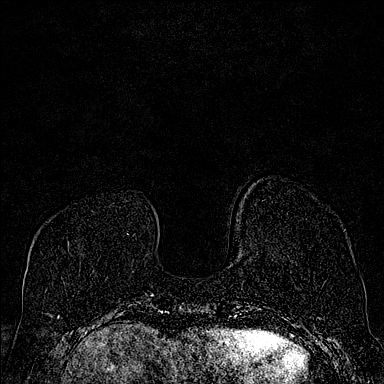
[im 72/144]
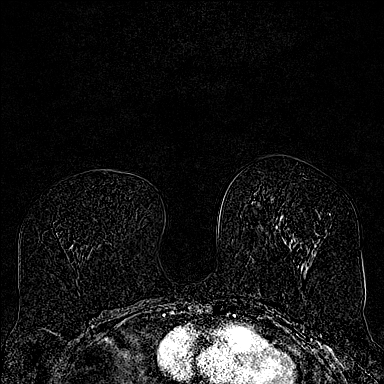
[im 108/144]
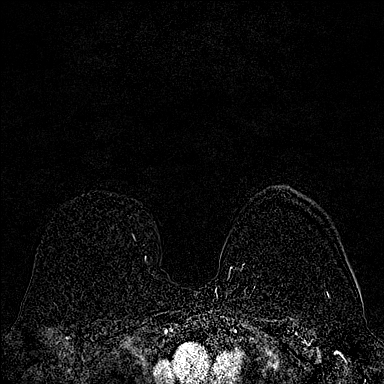
[im 144/144]
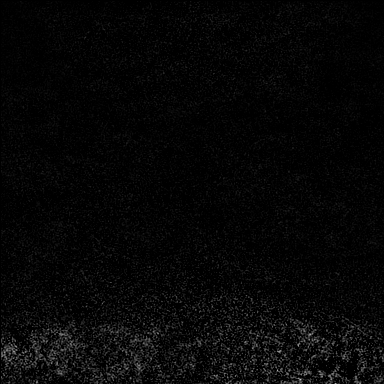

[Series 7: fl3d post-cm 20 · axial · 172.8mm · 0.94mm/px · 1 of 1 slices shown (3 of 3)]
[im 1/1]
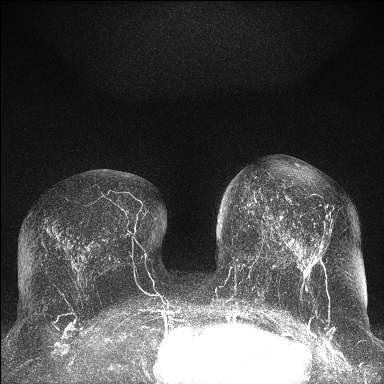

[Series 8: fl3d post-cm 3 · axial · 1.2mm · 0.94mm/px · z∈[-78,+93]mm · 6 of 144 slices shown (1 of 2)]
[im 1/144]
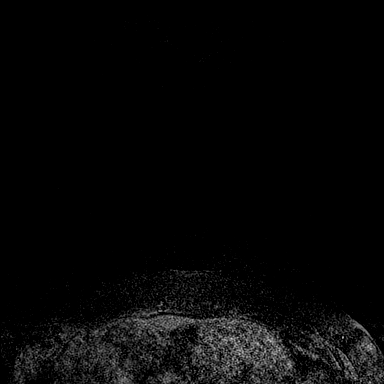
[im 29/144]
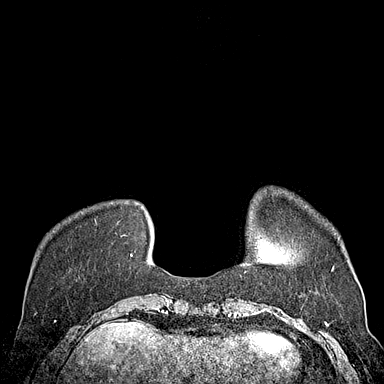
[im 58/144]
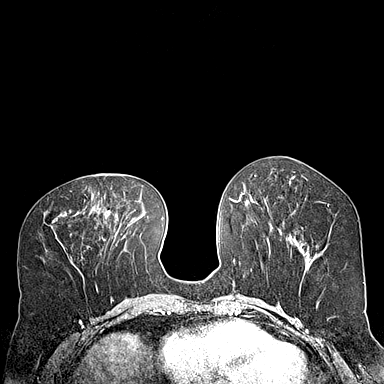
[im 86/144]
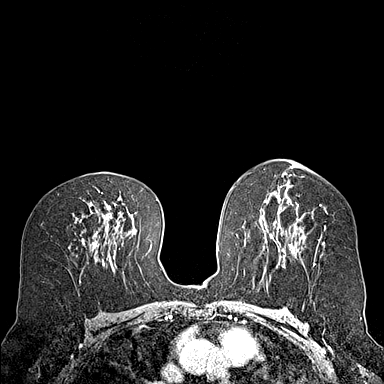
[im 115/144]
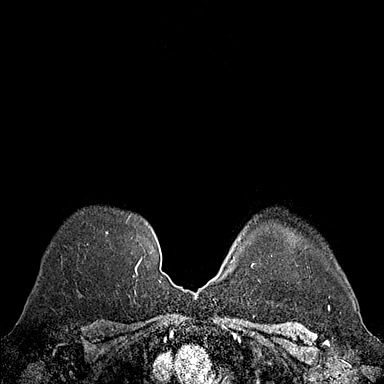
[im 144/144]
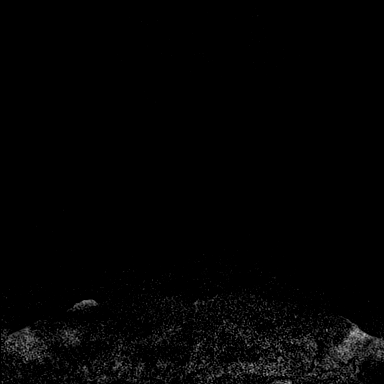

[Series 9: fl3d post-cm 3 · axial · 1.2mm · 0.94mm/px · z∈[-78,+24]mm · 4 of 144 slices shown (2 of 2)]
[im 1/144]
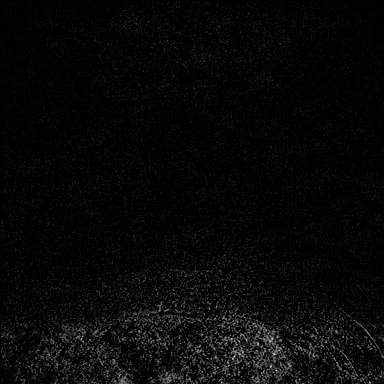
[im 29/144]
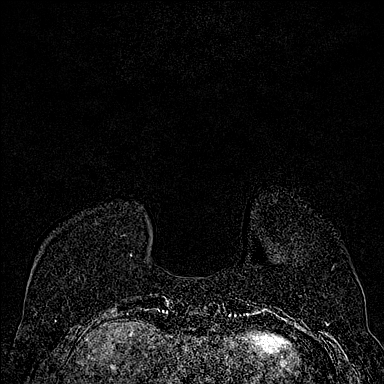
[im 58/144]
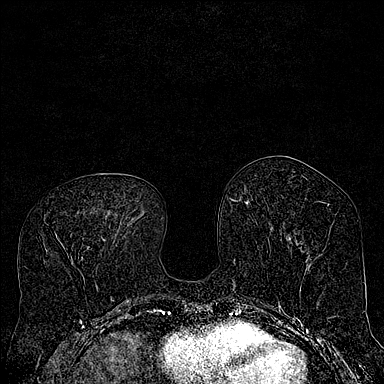
[im 86/144]
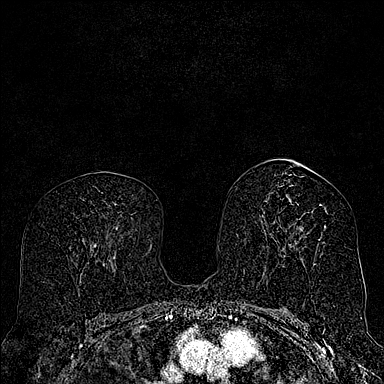

[32 of 48 positions shown; findings below may reference images not displayed]

Three-dimensional MR images were rendered by post-processing of the
original MR data on an independent workstation. The
three-dimensional MR images were interpreted, and findings are
reported in the following complete MRI report for this study. Three
dimensional images were evaluated at the independent interpreting
workstation using the DynaCAD thin client.
FINDINGS: Breast composition: b. Scattered fibroglandular tissue.

Background parenchymal enhancement: Mild

Right breast: The previously biopsied retroareolar linear
enhancement is significantly less prominent today. There is minimal,
subtle residual enhancement in that region. No interval mass or
enhancement suspicious for malignancy.

Left breast: No mass or abnormal enhancement.

Lymph nodes: No abnormal appearing lymph nodes.

Ancillary findings:  None.
IMPRESSION: 1. The previously biopsied retroareolar linear enhancement on the
right is significantly less prominent today, compatible with minimal
residual periductal inflammation/fibrosis.
2. No evidence of malignancy in either breast.

RECOMMENDATION:
Bilateral screening mammogram November 2021 when due.

BI-RADS CATEGORY  2: Benign.

## 2023-04-25 ENCOUNTER — Other Ambulatory Visit (HOSPITAL_BASED_OUTPATIENT_CLINIC_OR_DEPARTMENT_OTHER): Payer: Self-pay

## 2023-04-25 DIAGNOSIS — Z91018 Allergy to other foods: Secondary | ICD-10-CM | POA: Diagnosis not present

## 2023-04-25 MED ORDER — EPINEPHRINE 0.3 MG/0.3ML IJ SOAJ
0.3000 mg | Freq: Once | INTRAMUSCULAR | 1 refills | Status: AC
  Filled 2023-04-25: qty 2, 30d supply, fill #0

## 2023-10-07 DIAGNOSIS — S82142A Displaced bicondylar fracture of left tibia, initial encounter for closed fracture: Secondary | ICD-10-CM

## 2023-10-07 HISTORY — DX: Displaced bicondylar fracture of left tibia, initial encounter for closed fracture: S82.142A

## 2023-10-08 ENCOUNTER — Other Ambulatory Visit: Payer: Self-pay

## 2023-10-08 ENCOUNTER — Other Ambulatory Visit (INDEPENDENT_AMBULATORY_CARE_PROVIDER_SITE_OTHER): Payer: Self-pay

## 2023-10-08 ENCOUNTER — Encounter: Payer: Self-pay | Admitting: Sports Medicine

## 2023-10-08 ENCOUNTER — Ambulatory Visit (INDEPENDENT_AMBULATORY_CARE_PROVIDER_SITE_OTHER): Payer: 59 | Admitting: Sports Medicine

## 2023-10-08 DIAGNOSIS — M25562 Pain in left knee: Secondary | ICD-10-CM | POA: Diagnosis not present

## 2023-10-08 DIAGNOSIS — M1712 Unilateral primary osteoarthritis, left knee: Secondary | ICD-10-CM

## 2023-10-08 DIAGNOSIS — G8929 Other chronic pain: Secondary | ICD-10-CM

## 2023-10-08 DIAGNOSIS — M25462 Effusion, left knee: Secondary | ICD-10-CM

## 2023-10-08 MED ORDER — BUPIVACAINE HCL 0.25 % IJ SOLN
2.0000 mL | INTRAMUSCULAR | Status: AC | PRN
  Administered 2023-10-08: 2 mL via INTRA_ARTICULAR

## 2023-10-08 MED ORDER — LIDOCAINE HCL 1 % IJ SOLN
2.0000 mL | INTRAMUSCULAR | Status: AC | PRN
  Administered 2023-10-08: 2 mL

## 2023-10-08 MED ORDER — METHYLPREDNISOLONE ACETATE 40 MG/ML IJ SUSP
80.0000 mg | INTRAMUSCULAR | Status: AC | PRN
  Administered 2023-10-08: 80 mg via INTRA_ARTICULAR
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
## 2023-11-15 ENCOUNTER — Other Ambulatory Visit: Payer: Self-pay | Admitting: Obstetrics and Gynecology

## 2023-11-15 DIAGNOSIS — Z1231 Encounter for screening mammogram for malignant neoplasm of breast: Secondary | ICD-10-CM

## 2024-01-03 ENCOUNTER — Ambulatory Visit
Admission: RE | Admit: 2024-01-03 | Discharge: 2024-01-03 | Disposition: A | Discharge status: Home / Self Care | Source: Ambulatory Visit | Attending: Obstetrics and Gynecology | Admitting: Obstetrics and Gynecology

## 2024-01-03 DIAGNOSIS — Z1231 Encounter for screening mammogram for malignant neoplasm of breast: Secondary | ICD-10-CM | POA: Diagnosis not present

## 2024-01-04 DIAGNOSIS — Z0000 Encounter for general adult medical examination without abnormal findings: Secondary | ICD-10-CM | POA: Diagnosis not present

## 2024-01-04 DIAGNOSIS — Z13220 Encounter for screening for lipoid disorders: Secondary | ICD-10-CM | POA: Diagnosis not present

## 2024-01-08 ENCOUNTER — Encounter: Payer: Self-pay | Admitting: Obstetrics and Gynecology

## 2024-01-11 DIAGNOSIS — Z0000 Encounter for general adult medical examination without abnormal findings: Secondary | ICD-10-CM | POA: Diagnosis not present
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
## 2024-03-20 ENCOUNTER — Encounter: Payer: Self-pay | Admitting: Obstetrics and Gynecology

## 2024-03-20 ENCOUNTER — Ambulatory Visit (INDEPENDENT_AMBULATORY_CARE_PROVIDER_SITE_OTHER): Admitting: Obstetrics and Gynecology

## 2024-03-20 VITALS — BP 106/64 | HR 68 | Ht 65.25 in | Wt 212.0 lb

## 2024-03-20 DIAGNOSIS — Z1331 Encounter for screening for depression: Secondary | ICD-10-CM

## 2024-03-20 DIAGNOSIS — Z01419 Encounter for gynecological examination (general) (routine) without abnormal findings: Secondary | ICD-10-CM | POA: Diagnosis not present

## 2024-03-20 DIAGNOSIS — Z780 Asymptomatic menopausal state: Secondary | ICD-10-CM | POA: Diagnosis not present

## 2024-03-20 DIAGNOSIS — Z8781 Personal history of (healed) traumatic fracture: Secondary | ICD-10-CM | POA: Diagnosis not present

## 2024-03-20 NOTE — Patient Instructions (Addendum)
 Phone number for scheduling bone density at [facility], [phone].    EXERCISE AND DIET:  We recommended that you start or continue a regular exercise program for good health. Regular exercise means any activity that makes your heart beat faster and makes you sweat.  We recommend exercising at least 30 minutes per day at least 3 days a week, preferably 4 or 5.  We also recommend a diet low in fat and sugar.  Inactivity, poor dietary choices and obesity can cause diabetes, heart attack, stroke, and kidney damage, among others.    ALCOHOL AND SMOKING:  Women should limit their alcohol intake to no more than 7 drinks/beers/glasses of wine (combined, not each!) per week. Moderation of alcohol intake to this level decreases your risk of breast cancer and liver damage. And of course, no recreational drugs are part of a healthy lifestyle.  And absolutely no smoking or even second hand smoke. Most people know smoking can cause heart and lung diseases, but did you know it also contributes to weakening of your bones? Aging of your skin?  Yellowing of your teeth and nails?  CALCIUM AND VITAMIN D:  Adequate intake of calcium and Vitamin D are recommended.  The recommendations for exact amounts of these supplements seem to change often, but generally speaking 600 mg of calcium (either carbonate or citrate) and 800 units of Vitamin D per day seems prudent. Certain women may benefit from higher intake of Vitamin D.  If you are among these women, your doctor will have told you during your visit.    PAP SMEARS:  Pap smears, to check for cervical cancer or precancers,  have traditionally been done yearly, although recent scientific advances have shown that most women can have pap smears less often.  However, every woman still should have a physical exam from her gynecologist every year. It will include a breast check, inspection of the vulva and vagina to check for abnormal growths or skin changes, a visual exam of the  cervix, and then an exam to evaluate the size and shape of the uterus and ovaries.  And after 61 years of age, a rectal exam is indicated to check for rectal cancers. We will also provide age appropriate advice regarding health maintenance, like when you should have certain vaccines, screening for sexually transmitted diseases, bone density testing, colonoscopy, mammograms, etc.   MAMMOGRAMS:  All women over 61 years old should have a yearly mammogram. Many facilities now offer a "3D" mammogram, which may cost around $50 extra out of pocket. If possible,  we recommend you accept the option to have the 3D mammogram performed.  It both reduces the number of women who will be called back for extra views which then turn out to be normal, and it is better than the routine mammogram at detecting truly abnormal areas.    COLONOSCOPY:  Colonoscopy to screen for colon cancer is recommended for all women at age 61.  We know, you hate the idea of the prep.  We agree, BUT, having colon cancer and not knowing it is worse!!  Colon cancer so often starts as a polyp that can be seen and removed at colonscopy, which can quite literally save your life!  And if your first colonoscopy is normal and you have no family history of colon cancer, most women don't have to have it again for 10 years.  Once every ten years, you can do something that may end up saving your life, right?  We will  be happy to help you get it scheduled when you are ready.  Be sure to check your insurance coverage so you understand how much it will cost.  It may be covered as a preventative service at no cost, but you should check your particular policy.

## 2024-05-21 ENCOUNTER — Other Ambulatory Visit (HOSPITAL_BASED_OUTPATIENT_CLINIC_OR_DEPARTMENT_OTHER): Payer: Self-pay

## 2024-05-21 ENCOUNTER — Other Ambulatory Visit: Payer: Self-pay | Admitting: Family

## 2024-05-21 MED ORDER — AMOXICILLIN 875 MG PO TABS
875.0000 mg | ORAL_TABLET | Freq: Two times a day (BID) | ORAL | 0 refills | Status: AC
  Filled 2024-05-21: qty 20, 10d supply, fill #0

## 2024-05-21 MED ORDER — AMOXICILLIN 875 MG PO TABS
875.0000 mg | ORAL_TABLET | Freq: Two times a day (BID) | ORAL | 0 refills | Status: DC
  Filled 2024-05-21: qty 20, 10d supply, fill #0

## 2024-07-07 ENCOUNTER — Encounter: Payer: Self-pay | Admitting: Radiology

## 2024-07-17 ENCOUNTER — Other Ambulatory Visit (HOSPITAL_BASED_OUTPATIENT_CLINIC_OR_DEPARTMENT_OTHER): Payer: Self-pay

## 2024-07-17 DIAGNOSIS — I82409 Acute embolism and thrombosis of unspecified deep veins of unspecified lower extremity: Secondary | ICD-10-CM | POA: Diagnosis not present

## 2024-07-17 DIAGNOSIS — Z789 Other specified health status: Secondary | ICD-10-CM | POA: Diagnosis not present

## 2024-07-17 DIAGNOSIS — E782 Mixed hyperlipidemia: Secondary | ICD-10-CM | POA: Diagnosis not present

## 2024-07-17 DIAGNOSIS — E559 Vitamin D deficiency, unspecified: Secondary | ICD-10-CM | POA: Diagnosis not present

## 2024-07-17 DIAGNOSIS — Z7185 Encounter for immunization safety counseling: Secondary | ICD-10-CM | POA: Diagnosis not present

## 2024-07-17 DIAGNOSIS — K219 Gastro-esophageal reflux disease without esophagitis: Secondary | ICD-10-CM | POA: Diagnosis not present

## 2024-07-17 MED ORDER — ROSUVASTATIN CALCIUM 5 MG PO TABS
5.0000 mg | ORAL_TABLET | Freq: Every day | ORAL | 3 refills | Status: AC
  Filled 2024-07-17: qty 90, 90d supply, fill #0

## 2024-07-27 DIAGNOSIS — Z6835 Body mass index (BMI) 35.0-35.9, adult: Secondary | ICD-10-CM | POA: Diagnosis not present

## 2024-07-27 DIAGNOSIS — E66812 Obesity, class 2: Secondary | ICD-10-CM | POA: Diagnosis not present

## 2024-07-27 DIAGNOSIS — Z713 Dietary counseling and surveillance: Secondary | ICD-10-CM | POA: Diagnosis not present

## 2024-08-15 DIAGNOSIS — Z713 Dietary counseling and surveillance: Secondary | ICD-10-CM | POA: Diagnosis not present

## 2024-08-15 DIAGNOSIS — Z6835 Body mass index (BMI) 35.0-35.9, adult: Secondary | ICD-10-CM | POA: Diagnosis not present

## 2024-09-01 ENCOUNTER — Other Ambulatory Visit (HOSPITAL_BASED_OUTPATIENT_CLINIC_OR_DEPARTMENT_OTHER): Payer: Self-pay

## 2024-09-01 DIAGNOSIS — U071 COVID-19: Secondary | ICD-10-CM | POA: Diagnosis not present

## 2024-09-01 MED ORDER — PAXLOVID (300/100) 20 X 150 MG & 10 X 100MG PO TBPK
3.0000 | ORAL_TABLET | Freq: Two times a day (BID) | ORAL | 0 refills | Status: AC
  Filled 2024-09-01: qty 30, 5d supply, fill #0

## 2024-09-18 ENCOUNTER — Other Ambulatory Visit: Payer: Self-pay

## 2024-09-18 ENCOUNTER — Other Ambulatory Visit (HOSPITAL_BASED_OUTPATIENT_CLINIC_OR_DEPARTMENT_OTHER): Payer: Self-pay

## 2024-09-18 ENCOUNTER — Other Ambulatory Visit (HOSPITAL_COMMUNITY): Payer: Self-pay

## 2024-09-18 MED ORDER — ROSUVASTATIN CALCIUM 5 MG PO TABS
5.0000 mg | ORAL_TABLET | Freq: Every day | ORAL | 3 refills | Status: AC
  Filled 2024-09-18 – 2024-09-29 (×3): qty 90, 90d supply, fill #0

## 2024-09-18 MED ORDER — EPINEPHRINE 0.3 MG/0.3ML IJ SOAJ
0.3000 mg | Freq: Once | INTRAMUSCULAR | 0 refills | Status: AC
  Filled 2024-09-18 (×2): qty 2, 2d supply, fill #0

## 2024-09-19 ENCOUNTER — Other Ambulatory Visit: Payer: Self-pay

## 2024-09-29 ENCOUNTER — Other Ambulatory Visit (HOSPITAL_COMMUNITY): Payer: Self-pay

## 2024-10-09 ENCOUNTER — Other Ambulatory Visit (HOSPITAL_COMMUNITY): Payer: Self-pay

## 2024-11-11 ENCOUNTER — Other Ambulatory Visit (HOSPITAL_BASED_OUTPATIENT_CLINIC_OR_DEPARTMENT_OTHER)

## 2025-03-25 ENCOUNTER — Ambulatory Visit: Admitting: Obstetrics and Gynecology
